# Patient Record
Sex: Female | Born: 1995 | Hispanic: Yes | Smoking: Never smoker
Health system: Southern US, Community
[De-identification: ages and names within clinical notes are randomized; demographics above are authoritative.]

## PROBLEM LIST (undated history)

## (undated) DIAGNOSIS — E282 Polycystic ovarian syndrome: Secondary | ICD-10-CM

## (undated) DIAGNOSIS — F4310 Post-traumatic stress disorder, unspecified: Secondary | ICD-10-CM

## (undated) DIAGNOSIS — E663 Overweight: Secondary | ICD-10-CM

## (undated) DIAGNOSIS — L409 Psoriasis, unspecified: Secondary | ICD-10-CM

## (undated) HISTORY — DX: Overweight: E66.3

## (undated) HISTORY — DX: Post-traumatic stress disorder, unspecified: F43.10

## (undated) HISTORY — DX: Polycystic ovarian syndrome: E28.2

## (undated) HISTORY — DX: Psoriasis, unspecified: L40.9

## (undated) HISTORY — PX: NO PAST SURGERIES: SHX2092

---
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
## 2017-09-06 ENCOUNTER — Encounter: Payer: Self-pay | Admitting: Emergency Medicine

## 2017-09-06 ENCOUNTER — Emergency Department
Admission: EM | Admit: 2017-09-06 | Discharge: 2017-09-06 | Disposition: A | Discharge status: Home / Self Care | Payer: Managed Care, Other (non HMO) | Attending: Emergency Medicine | Admitting: Emergency Medicine

## 2017-09-06 ENCOUNTER — Other Ambulatory Visit: Payer: Self-pay

## 2017-09-06 ENCOUNTER — Emergency Department: Payer: Managed Care, Other (non HMO)

## 2017-09-06 DIAGNOSIS — R079 Chest pain, unspecified: Secondary | ICD-10-CM | POA: Diagnosis not present

## 2017-09-06 LAB — BASIC METABOLIC PANEL
Anion gap: 6 (ref 5–15)
BUN: 10 mg/dL (ref 6–20)
CALCIUM: 8.7 mg/dL — AB (ref 8.9–10.3)
CO2: 27 mmol/L (ref 22–32)
Chloride: 105 mmol/L (ref 101–111)
Creatinine, Ser: 0.78 mg/dL (ref 0.44–1.00)
GFR calc Af Amer: >60 mL/min (ref >60)
GLUCOSE: 93 mg/dL (ref 65–99)
Potassium: 3.9 mmol/L (ref 3.5–5.1)
Sodium: 138 mmol/L (ref 135–145)

## 2017-09-06 LAB — CBC
HCT: 37.7 % (ref 35.0–47.0)
Hemoglobin: 12.7 g/dL (ref 12.0–16.0)
MCH: 28.1 pg (ref 26.0–34.0)
MCHC: 33.7 g/dL (ref 32.0–36.0)
MCV: 83.4 fL (ref 80.0–100.0)
Platelets: 316 10*3/uL (ref 150–440)
RBC: 4.52 MIL/uL (ref 3.80–5.20)
RDW: 13.8 % (ref 11.5–14.5)
WBC: 9.2 10*3/uL (ref 3.6–11.0)

## 2017-09-06 LAB — PREGNANCY, URINE: PREG TEST UR: NEGATIVE

## 2017-09-06 LAB — POCT PREGNANCY, URINE: PREG TEST UR: NEGATIVE

## 2017-09-06 LAB — TROPONIN I

## 2017-09-06 NOTE — ED Triage Notes (Signed)
Pt here via EMS from work with c/o midsternal cp that began a few months ago, worsening today, states pain radiates to her upper back, states she was not doing anything "active" when pain came on today. Denies cp at this time, tearful upon arriving to triage.

## 2017-09-06 NOTE — ED Provider Notes (Signed)
[facility] Emergency Department Provider Note  ___________________________________________   First MD Initiated Contact with Patient 09/06/17 1816     (approximate)  I have reviewed the triage vital signs and the nursing notes.   HISTORY  Chief Complaint Chest Pain   HPI Kimberly Cummings is a 22 y.o. female with a history of psoriasis was presented to the emergency department today with chest pain over the past several months.  She says that she has chest pain across the front of her chest which lasts 1 to 2 minutes it is a pressure type pain which can be severe at times.  She denies any radiation but says that she does feel short of breath when she has the pain.  She said that she did episode of the pain today while at work that lasted for about 2 minutes.  Says that she felt her heart racing at the time and checked her heart rate on her watch which that it was at 116.  She then had a nurse at work check her heart rate and found to be 120.  However, he says the symptoms completely resolved after 2 minutes.  She continues to be symptom-free at this time.  Says that she gets the symptoms several times a day over the past 3 months.  Denies anything during the month such as exertion or anxiety.  No history of blood clots or sudden death at young age in her family.  Is not on any medications.  History reviewed. No pertinent past medical history.  There are no active problems to display for this patient.   History reviewed. No pertinent surgical history.  Prior to Admission medications   Not on File    Allergies Patient has no known allergies.  No family history on file.  Social History Social History   Tobacco Use  . Smoking status: Never Smoker  . Smokeless tobacco: Never Used  Substance Use Topics  . Alcohol use: Yes    Comment: occas.   . Drug use: Not on file    Review of Systems  Constitutional: No fever/chills Eyes: No visual  changes. ENT: No sore throat. Cardiovascular: As above Respiratory: As above Gastrointestinal: No abdominal pain.  No nausea, no vomiting.  No diarrhea.  No constipation. Genitourinary: Negative for dysuria. Musculoskeletal: Negative for back pain. Skin: Negative for rash. Neurological: Negative for headaches, focal weakness or numbness.   ____________________________________________   PHYSICAL EXAM:  VITAL SIGNS: ED Triage Vitals [09/06/17 1637]  Enc Vitals Group     BP 113/74     Pulse Rate 81     Resp 16     Temp 98.6 F (37 C)     Temp Source Oral     SpO2 99 %     Weight 200 lb (90.7 kg)     Height 5\' 2"  (1.575 m)     Head Circumference      Peak Flow      Pain Score 0     Pain Loc      Pain Edu?      Excl. in GC?     Constitutional: Alert and oriented. Well appearing and in no acute distress. Eyes: Conjunctivae are normal.  Head: Atraumatic. Nose: No congestion/rhinnorhea. Mouth/Throat: Mucous membranes are moist.  Neck: No stridor.   Cardiovascular: Normal rate, regular rhythm. Grossly normal heart sounds.  Good peripheral circulation with equal and bilateral radial pulses.  Chest pain is not reproducible to palpation. Respiratory: Normal respiratory  effort.  No retractions. Lungs CTAB. Gastrointestinal: Soft and nontender. No distention.  Musculoskeletal: No lower extremity tenderness nor edema.  No joint effusions. Neurologic:  Normal speech and language. No gross focal neurologic deficits are appreciated. Skin:  Skin is warm, dry and intact. No rash noted. Psychiatric: Mood and affect are normal. Speech and behavior are normal.  ____________________________________________   LABS (all labs ordered are listed, but only abnormal results are displayed)  Labs Reviewed  BASIC METABOLIC PANEL - Abnormal; Notable for the following components:      Result Value   Calcium 8.7 (*)    All other components within normal limits  CBC  TROPONIN I  PREGNANCY,  URINE   ____________________________________________  EKG  ED ECG REPORT I, Arelia Longest, the attending physician, personally viewed and interpreted this ECG.   Date: 09/06/2017  EKG Time: 1632  Rate: 72  Rhythm: normal sinus rhythm  Axis: Normal  Intervals:none  ST&T Change: No ST segment elevations or depressions.  T wave inversions in 3, aVL as well as V2 through V4.  No previous for comparison.  ____________________________________________  RADIOLOGY  No acute finding on the chest x-ray ____________________________________________   PROCEDURES  Procedure(s) performed:   Procedures  Critical Care performed:   ____________________________________________   INITIAL IMPRESSION / ASSESSMENT AND PLAN / ED COURSE  Pertinent labs & imaging results that were available during my care of the patient were reviewed by me and considered in my medical decision making (see chart for details).  Differential diagnosis includes, but is not limited to, ACS, aortic dissection, pulmonary embolism, cardiac tamponade, pneumothorax, pneumonia, pericarditis, myocarditis, GI-related causes including esophagitis/gastritis, and musculoskeletal chest wall pain.   As part of my medical decision making, I reviewed the following data within the electronic MEDICAL RECORD NUMBERNo previous records on file for review.  ----------------------------------------- 7:07 PM on 09/06/2017 -----------------------------------------  Symptoms completely resolved.  Unlikely to be pulmonary embolus secondary to complete resolution of symptoms and very brief and intermittent symptoms over the past 3 months.  EKG abnormalities with T wave abnormality which is nonspecific.  Normal troponin after several months of symptoms.  Patient continues to be symptom medic in the emergency department.  I feel that she is appropriate for outpatient follow-up.  We will give her a cardiology follow-up.  She is understanding of  the treatment plan as well as the diagnosis and willing to comply with outpatient follow-up. ____________________________________________   FINAL CLINICAL IMPRESSION(S) / ED DIAGNOSES  Chest pain    NEW MEDICATIONS STARTED DURING THIS VISIT:  New Prescriptions   No medications on file     Note:  This document was prepared using Dragon voice recognition software and may include unintentional dictation errors.     Myrna Blazer, MD 09/06/17 [place] Moment

## 2017-09-06 NOTE — ED Notes (Signed)
In lobby from EMS. Chest pain.  Pulled for EKG.  NAD

## 2017-09-06 NOTE — ED Notes (Signed)
EMS pt from work, chest pain

## 2017-09-06 NOTE — ED Notes (Signed)
POCT PREG NEG 

## 2017-09-08 ENCOUNTER — Telehealth: Payer: Self-pay | Admitting: *Deleted

## 2017-09-08 NOTE — Telephone Encounter (Signed)
Left message for patient to call and schedule.

## 2017-09-08 NOTE — Telephone Encounter (Signed)
Left message for patient to call and schedule New Patient [facility] follow up visit

## 2017-09-12 NOTE — Telephone Encounter (Signed)
Left message for patient to call and schedule.

## 2017-09-14 NOTE — Telephone Encounter (Signed)
Left message for patient to call and schedule post ED visit 

## 2017-09-19 DIAGNOSIS — R079 Chest pain, unspecified: Secondary | ICD-10-CM

## 2017-09-19 DIAGNOSIS — R0789 Other chest pain: Secondary | ICD-10-CM | POA: Insufficient documentation

## 2017-09-19 DIAGNOSIS — F4310 Post-traumatic stress disorder, unspecified: Secondary | ICD-10-CM | POA: Insufficient documentation

## 2017-09-20 ENCOUNTER — Ambulatory Visit: Payer: Managed Care, Other (non HMO) | Admitting: Cardiology

## 2017-10-11 ENCOUNTER — Encounter: Payer: Self-pay | Admitting: Cardiology

## 2017-10-11 ENCOUNTER — Ambulatory Visit (INDEPENDENT_AMBULATORY_CARE_PROVIDER_SITE_OTHER): Payer: Managed Care, Other (non HMO) | Admitting: Cardiology

## 2017-10-11 VITALS — BP 102/66 | HR 74 | Ht 62.0 in | Wt 219.0 lb

## 2017-10-11 DIAGNOSIS — R0789 Other chest pain: Secondary | ICD-10-CM

## 2017-10-11 DIAGNOSIS — Z9189 Other specified personal risk factors, not elsewhere classified: Secondary | ICD-10-CM | POA: Diagnosis not present

## 2017-10-11 DIAGNOSIS — F4310 Post-traumatic stress disorder, unspecified: Secondary | ICD-10-CM | POA: Diagnosis not present

## 2017-10-11 DIAGNOSIS — R0683 Snoring: Secondary | ICD-10-CM | POA: Insufficient documentation

## 2017-10-11 DIAGNOSIS — E669 Obesity, unspecified: Secondary | ICD-10-CM | POA: Diagnosis not present

## 2017-10-11 DIAGNOSIS — E785 Hyperlipidemia, unspecified: Secondary | ICD-10-CM | POA: Diagnosis not present

## 2017-10-11 HISTORY — DX: Snoring: R06.83

## 2017-10-11 NOTE — Patient Instructions (Signed)
Medication Instructions:  Your physician recommends that you continue on your current medications as directed. Please refer to the Current Medication list given to you today.   Labwork: None  Testing/Procedures: You had an EKG today.   Your physician has requested that you have an echocardiogram. Echocardiography is a painless test that uses sound waves to create images of your heart. It provides your doctor with information about the size and shape of your heart and how well your heart's chambers and valves are working. This procedure takes approximately one hour. There are no restrictions for this procedure.  Your physician has requested that you have a stress echocardiogram. For further information please visit [email or web address] Please follow instruction sheet as given.  You have been referred for a sleep study. You will be contacted to schedule this appointment.   Follow-Up: Your physician recommends that you schedule a follow-up appointment in: 1 month.   If you need a refill on your cardiac medications before your next appointment, please call your pharmacy.   Thank you for choosing [facility]! Mady Gemma, RN [phone]

## 2017-10-11 NOTE — Progress Notes (Signed)
Cardiology Consultation:    Date:  10/11/2017   ID:  Kimberly Cummings, DOB 11-02-1995, MRN [id number]  PCP:  Renford Dills, MD  Cardiologist:  Gypsy Balsam, MD   Referring MD: Renford Dills, MD   Chief Complaint  Patient presents with  . Follow up from ED  Chest pain  History of Present Illness:    Kimberly Cummings is a 22 y.o. female who is being seen today for the evaluation of chest pain at the request of Renford Dills, MD.  For last few months she is being experiencing chest pain.  She describes as heaviness in the chest without radiation severity of the sensation is between 5-9 sometimes it is only mild and she can simply do what she does without being attention to the goes away sometimes she have to stop doing what she does sit down take few deep breaths and typically pain will go away typical duration of the pain is between 30 seconds to about 45 seconds.  Sometimes even shorter.  She does have some shortness of breath associated with this sensation there is also some sweating.  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 She does not have any significant risk factors for coronary artery disease but her LDL is 116 and she does have some family history of premature coronary artery disease. She does not smoke. She snores a lot and she tells me that sometimes she wakes up in the middle of the night with dry mouth.  It is also very easy for her to fall asleep during the day.  Past Medical History:  Diagnosis Date  . Overweight   . Psoriasis   . PTSD (post-traumatic stress disorder)     Past Surgical History:  Procedure Laterality Date  . NO PAST SURGERIES      Current Medications: No outpatient medications have been marked as taking for the 10/11/17 encounter (Office Visit) with  Georgeanna Lea, MD.     Allergies:   Patient has no known allergies.   Social History   Socioeconomic History  . Marital status: Single    Spouse name: Not on file  . Number of children: Not on file  . Years of education: Not on file  . Highest education level: Not on file  Occupational History  . Not on file  Social Needs  . Financial resource strain: Not on file  . Food insecurity:    Worry: Not on file    Inability: Not on file  . Transportation needs:    Medical: Not on file    Non-medical: Not on file  Tobacco Use  . Smoking status: Never Smoker  . Smokeless tobacco: Never Used  Substance and Sexual Activity  . Alcohol use: Yes    Comment: occas.   . Drug use: Never  . Sexual activity: Not on file  Lifestyle  . Physical activity:    Days per week: Not on file    Minutes per session: Not on file  . Stress: Not on file  Relationships  . Social connections:    Talks on phone: Not on file    Gets together: Not on file    Attends religious service: Not on file    Active member of club or organization: Not on file  Attends meetings of clubs or organizations: Not on file    Relationship status: Not on file  Other Topics Concern  . Not on file  Social History Narrative  . Not on file     Family History: The patient's family history includes Hypertension in her father. ROS:   Please see the history of present illness.    All 14 point review of systems negative except as described per history of present illness.  EKGs/Labs/Other Studies Reviewed:    The following studies were reviewed today: EKG done in the emergency room showed normal sinus rhythm normal P interval T inversion in lead V1 to V3 and even V4.  There was also some T inversion in V3 no changes are nonspecific partially meeting criteria for juvenile pattern.    Recent Labs: 09/06/2017: BUN 10; Creatinine, Ser 0.78; Hemoglobin 12.7; Platelets 316; Potassium 3.9; Sodium 138  Recent Lipid  Panel No results found for: CHOL, TRIG, HDL, CHOLHDL, VLDL, LDLCALC, LDLDIRECT  Physical Exam:    VS:  BP 102/66   Pulse 74   Ht  (1.575 m)   Wt 219 lb (99.3 kg)   SpO2 98%   BMI 40.06 kg/m     Wt Readings from Last 3 Encounters:  10/11/17 219 lb (99.3 kg)  09/06/17 200 lb (90.7 kg)     GEN:  Well nourished, well developed in no acute distress HEENT: Normal NECK: No JVD; No carotid bruits LYMPHATICS: No lymphadenopathy CARDIAC: RRR, no murmurs, no rubs, no gallops RESPIRATORY:  Clear to auscultation without rales, wheezing or rhonchi  ABDOMEN: Soft, non-tender, non-distended MUSCULOSKELETAL:  No edema; No deformity  SKIN: Warm and dry NEUROLOGIC:  Alert and oriented x 3 PSYCHIATRIC:  Normal affect   ASSESSMENT:    1. Atypical chest pain   2. Dyslipidemia   3. Snoring   4. Posttraumatic stress disorder   5. Obesity (BMI 30-39.9)    PLAN:    In order of problems listed above:  1. Atypical chest pain in this young woman with very few risk factors for coronary artery disease.  I doubt very much that this is a cardiac related but I am worried about her EKG having some minor abnormality which could be simply related to juvenile pattern.  I think the best course of action will be to do the following I will ask her to have echocardiogram to assess left ventricular ejection fraction.  That will also allow me to look at the pulmonary pressure.  I suspect she does have significant sleep apnea she may have already elevated pulmonary artery pressure which refers shortness of breath with exertion.  We will not initiate any medications for now.  Again I have a low level suspicion that she gets significant coronary artery disease.  She will be scheduled also to have stress echocardiogram to make sure there is no inducible ischemia 2. Dyslipidemia her LDL is 116 her HDL is 46.  Will wait for results of stress test and evaluation in order to judge her therapy.  So far she is being  instructed about dieting and also exercises that should help. 3. PTSD: This followed by primary care physician 4. Snoring I suspect sleep apnea.  She agreed to have sleep study which I think will be very reasonable.  Obviously weight management will help if she loses a few pounds that will help with sleep apnea as well.  Her back in my office in about 1 month or sooner if she had a problem  Medication Adjustments/Labs and Tests Ordered: Current medicines are reviewed at length with the patient today.  Concerns regarding medicines are outlined above.  No orders of the defined types were placed in this encounter.  No orders of the defined types were placed in this encounter.   Signed, Georgeanna Lea, MD, [facility]. 10/11/2017 3:26 PM    [facility]

## 2017-10-13 ENCOUNTER — Telehealth (HOSPITAL_COMMUNITY): Payer: Self-pay | Admitting: *Deleted

## 2017-10-13 NOTE — Telephone Encounter (Signed)
Left message on voicemail per DPR in reference to upcoming appointment scheduled on 10/19/17 at 2:30 with detailed instructions given per Stress Test Requisition Sheet for the test. LM to arrive 30 minutes early, and that it is imperative to arrive on time for appointment to keep from having the test rescheduled. If you need to cancel or reschedule your appointment, please call the office within 24 hours of your appointment. Failure to do so may result in a cancellation of your appointment, and a $50 no show fee. Phone number given for call back for any questions. Daneil DolinSharon S Brooks

## 2017-10-18 ENCOUNTER — Telehealth (HOSPITAL_COMMUNITY): Payer: Self-pay | Admitting: Cardiology

## 2017-10-19 ENCOUNTER — Other Ambulatory Visit (HOSPITAL_COMMUNITY): Payer: Managed Care, Other (non HMO)

## 2017-10-19 ENCOUNTER — Ambulatory Visit (HOSPITAL_COMMUNITY): Payer: Managed Care, Other (non HMO) | Attending: Cardiology

## 2017-10-19 ENCOUNTER — Other Ambulatory Visit: Payer: Self-pay

## 2017-10-19 DIAGNOSIS — R0789 Other chest pain: Secondary | ICD-10-CM | POA: Diagnosis present

## 2017-10-20 ENCOUNTER — Telehealth (HOSPITAL_COMMUNITY): Payer: Self-pay | Admitting: *Deleted

## 2017-10-20 NOTE — Telephone Encounter (Signed)
Left message on voicemail per DPR in reference to upcoming appointment scheduled on 10/26/17 at 2:30 with detailed instructions given per Myocardial Perfusion Study Information Sheet for the test. LM to arrive 15 minutes early, and that it is imperative to arrive on time for appointment to keep from having the test rescheduled. If you need to cancel or reschedule your appointment, please call the office within 24 hours of your appointment. Failure to do so may result in a cancellation of your appointment, and a $50 no show fee. Phone number given for call back for any questions.

## 2017-10-20 NOTE — Telephone Encounter (Signed)
User: Trina AoGRIFFIN, Iris Hairston A Date/time: 10/18/17 2:33 PM  Comment: Tried calling patient 2x to r/s stress echo appt but phone just rang and did not have a VM.  Context:  Outcome: No Answer/Busy  Phone number: [phone] Phone Type: Home Phone  Comm. type: Telephone Call type: Outgoing  Contact: Darlina Guysodriguez, Marticia Relation to patient: Self

## 2017-10-26 ENCOUNTER — Ambulatory Visit (HOSPITAL_COMMUNITY): Payer: Managed Care, Other (non HMO) | Attending: Cardiology

## 2017-10-26 ENCOUNTER — Ambulatory Visit (HOSPITAL_BASED_OUTPATIENT_CLINIC_OR_DEPARTMENT_OTHER): Payer: Managed Care, Other (non HMO)

## 2017-10-26 DIAGNOSIS — R0789 Other chest pain: Secondary | ICD-10-CM

## 2017-11-01 NOTE — Addendum Note (Signed)
Addended by: Crist FatLOCKHART, CATHERINE P on: 11/01/2017 08:34 AM   Modules accepted: Orders

## 2017-11-03 ENCOUNTER — Telehealth: Payer: Self-pay | Admitting: *Deleted

## 2017-11-03 NOTE — Telephone Encounter (Signed)
[facility]

## 2017-11-03 NOTE — Telephone Encounter (Signed)
-----   Message from Catherine P Lockhart, RN sent at 11/01/2017  8:33 AM EDT ----- Regarding: Please schedule Please schedule this patient for a sleep study due to snoring in evaluation of sleep apnea. Thanks! 

## 2017-11-10 ENCOUNTER — Ambulatory Visit: Payer: Managed Care, Other (non HMO) | Admitting: Cardiology

## 2017-11-14 ENCOUNTER — Telehealth: Payer: Self-pay | Admitting: *Deleted

## 2017-11-14 NOTE — Telephone Encounter (Signed)
-----   Message from Crist Fatatherine P Lockhart, RN sent at 11/01/2017  8:33 AM EDT ----- Regarding: Please schedule Please schedule this patient for a sleep study due to snoring in evaluation of sleep apnea. Thanks!

## 2017-11-14 NOTE — Telephone Encounter (Signed)
Staff message sent to Leda QuailCatherine [employer] denied in lab study. Approved HST. Ok to schedule HST.

## 2017-11-15 ENCOUNTER — Other Ambulatory Visit: Payer: Self-pay | Admitting: Cardiology

## 2017-11-15 DIAGNOSIS — R0683 Snoring: Secondary | ICD-10-CM

## 2017-11-21 ENCOUNTER — Telehealth: Payer: Self-pay | Admitting: *Deleted

## 2017-11-21 NOTE — Telephone Encounter (Signed)
Left message for patient on cell phone per DPR informing her that her sleep study will be an at home test due to insurance. Advised her that she is scheduled to pick up the equipment on 12/18/17 at 1:30 pm. Asked for patient to return my call to discuss the details of this process.

## 2017-11-21 NOTE — Addendum Note (Signed)
Addended by: Crist FatLOCKHART, CATHERINE P on: 11/21/2017 03:05 PM   Modules accepted: Orders

## 2017-12-18 ENCOUNTER — Ambulatory Visit (HOSPITAL_BASED_OUTPATIENT_CLINIC_OR_DEPARTMENT_OTHER): Payer: Managed Care, Other (non HMO) | Attending: Cardiology | Admitting: Cardiovascular Disease

## 2017-12-18 VITALS — Ht 62.0 in | Wt 215.0 lb

## 2017-12-18 DIAGNOSIS — G4710 Hypersomnia, unspecified: Secondary | ICD-10-CM | POA: Diagnosis not present

## 2017-12-18 DIAGNOSIS — G4719 Other hypersomnia: Secondary | ICD-10-CM

## 2017-12-18 DIAGNOSIS — R0683 Snoring: Secondary | ICD-10-CM | POA: Diagnosis not present

## 2017-12-26 ENCOUNTER — Encounter (HOSPITAL_BASED_OUTPATIENT_CLINIC_OR_DEPARTMENT_OTHER): Payer: Self-pay | Admitting: Cardiovascular Disease

## 2017-12-26 DIAGNOSIS — G4733 Obstructive sleep apnea (adult) (pediatric): Secondary | ICD-10-CM | POA: Diagnosis not present

## 2017-12-26 NOTE — Procedures (Signed)
    Patient Name: Kimberly Cummings, Kimberly Cummings Study Date: 12/18/2017 Gender: Female D.O.B: 1995-07-27 Age (years): 22 Referring Provider: Georgeanna Leaobert J Krasowski Height (inches): 62 Interpreting Physician: Nicki Guadalajarahomas Kelly MD, ABSM Weight (lbs): 215 [place]GT: [place] BMI: 39 MRN: [id number] Neck Size: 14.50  CLINICAL INFORMATION Sleep Study Type: HST  Indication for sleep study: Snoring  Epworth Sleepiness Score: 15  SLEEP STUDY TECHNIQUE A multi-channel overnight portable sleep study was performed. The channels recorded were: nasal airflow, thoracic respiratory movement, and oxygen saturation with a pulse oximetry. Snoring was also monitored.  MEDICATIONS none Patient self administered medications include: N/A.  SLEEP ARCHITECTURE Patient was studied for 426.6 minutes. The sleep efficiency was 100.0 % and the patient was supine for 35%. The arousal index was 0.0 per hour.  RESPIRATORY PARAMETERS The overall AHI was 1.1 per hour, with a central apnea index of 0.0 per hour.  The oxygen nadir was 90% during sleep.  CARDIAC DATA Mean heart rate during sleep was 66.9 bpm.  IMPRESSIONS - No significant obstructive sleep apnea occurred during this study (AHI  1.1/h). - No significant central sleep apnea occurred during this study (CAI = 0.0/h). - Sleep efficiency 100%. - The patient had minimal or no oxygen desaturation during the study (Min O2 = 90%) - Patient snored 2.5% during the sleep.  DIAGNOSIS - Normal study  - Excessive Daytime Sleepiness  RECOMMENDATIONS - There is no evidence for OSA on this home study. - In this patient with significant daytime sleepiness (ESS 15) and 100% sleep eficiency, if patient continues to be sleepy consider an evaluation for idiopathic hypersomnolence or narcolepsy with an in-lab overnight PSG followed by a 4- 5 nap multiple latency sleep test (MLST) following the overnight PSG - Avoid alcohol, sedatives and other CNS depressants that may  worsen sleep apnea and disrupt normal sleep architecture. - Sleep hygiene should be reviewed to assess factors that may improve sleep quality. - Weight management (BMI 39) and regular exercise should be initiated or continued.   [Electronically signed] 12/26/2017 05:54 PM  Nicki Guadalajarahomas Kelly MD, [facility] Center[facility]ACC, ABSM Diplomate, American Board of Sleep Medicine   NPI: [id number] [facility] PH: [phone]   FX: [phone] ACCREDITED BY THE AMERICAN ACADEMY OF SLEEP MEDICINE

## 2017-12-28 ENCOUNTER — Telehealth: Payer: Self-pay | Admitting: *Deleted

## 2017-12-28 ENCOUNTER — Other Ambulatory Visit: Payer: Self-pay | Admitting: Cardiovascular Disease

## 2017-12-28 DIAGNOSIS — R400 Somnolence: Secondary | ICD-10-CM

## 2017-12-28 DIAGNOSIS — R0683 Snoring: Secondary | ICD-10-CM

## 2017-12-28 NOTE — Telephone Encounter (Signed)
Faxed PA for PSG/MSLT to [employer].

## 2017-12-28 NOTE — Telephone Encounter (Signed)
-----   Message from Lennette Biharihomas A Kelly, MD sent at 12/26/2017  6:02 PM EDT ----- Kimberly Cummings  Please notify pt of normal study, but consider overnight PSG/MLST for excessive daytime sleepiness

## 2017-12-28 NOTE — Telephone Encounter (Signed)
Patient returned a call to me and was given sleep study results and recommendations. She has decided to do the NSPG/MLST to further evaluate her daytime somnolence.

## 2017-12-28 NOTE — Telephone Encounter (Signed)
Left message to return a call to discuss sleep study results. 

## 2017-12-28 NOTE — Progress Notes (Signed)
Patient notified

## 2018-01-08 ENCOUNTER — Other Ambulatory Visit: Payer: Self-pay

## 2018-01-08 ENCOUNTER — Encounter (HOSPITAL_COMMUNITY): Payer: Self-pay | Admitting: Emergency Medicine

## 2018-01-08 ENCOUNTER — Emergency Department (HOSPITAL_COMMUNITY)
Admission: EM | Admit: 2018-01-08 | Discharge: 2018-01-08 | Disposition: A | Discharge status: Home / Self Care | Payer: Managed Care, Other (non HMO) | Attending: Emergency Medicine | Admitting: Emergency Medicine

## 2018-01-08 DIAGNOSIS — Z5321 Procedure and treatment not carried out due to patient leaving prior to being seen by health care provider: Secondary | ICD-10-CM | POA: Insufficient documentation

## 2018-01-08 DIAGNOSIS — M549 Dorsalgia, unspecified: Secondary | ICD-10-CM | POA: Diagnosis not present

## 2018-01-08 DIAGNOSIS — M79604 Pain in right leg: Secondary | ICD-10-CM | POA: Diagnosis not present

## 2018-01-08 DIAGNOSIS — M79605 Pain in left leg: Secondary | ICD-10-CM | POA: Insufficient documentation

## 2018-01-08 NOTE — ED Notes (Signed)
No answer when called for room 

## 2018-01-08 NOTE — ED Notes (Signed)
Pt called to be roomed with no response.  RN notified. 

## 2018-01-08 NOTE — ED Triage Notes (Signed)
Pt reports having mid back pain that started last week now having radiation to bilateral legs.

## 2018-01-10 NOTE — ED Notes (Signed)
Follow up call made  No answer  01/10/18  0917  s Halil Rentz rn

## 2018-01-16 ENCOUNTER — Telehealth: Payer: Self-pay | Admitting: *Deleted

## 2018-01-16 NOTE — Telephone Encounter (Signed)
Resent HST to [employer] in attempt to get MLST approved.

## 2018-01-24 ENCOUNTER — Telehealth: Payer: Self-pay | Admitting: *Deleted

## 2018-01-24 NOTE — Telephone Encounter (Signed)
Left message to return a call to give MSLT/PSG appointment.

## 2018-01-25 NOTE — Telephone Encounter (Signed)
Patient notified of MSLT/PSG appointment. She informs me that her insurance will elapse at the end of the month or the beginning of nest month. I told her that her insurance has already given authorization to have the studies, however I don't know what how they will handle the claims that were approved prior to her policy termination. I recommended that she contact [employer] to ask about this and/or call the sleep lab to see if her appointment can be moved up to be done before her termination date. Patient voice understanding. [facility] contact information was given to the patient.

## 2018-02-25 ENCOUNTER — Encounter (HOSPITAL_BASED_OUTPATIENT_CLINIC_OR_DEPARTMENT_OTHER): Payer: Managed Care, Other (non HMO)

## 2018-02-26 ENCOUNTER — Encounter (HOSPITAL_BASED_OUTPATIENT_CLINIC_OR_DEPARTMENT_OTHER): Payer: Managed Care, Other (non HMO)

## 2018-07-22 ENCOUNTER — Other Ambulatory Visit: Payer: Self-pay

## 2018-07-22 ENCOUNTER — Emergency Department (HOSPITAL_COMMUNITY): Payer: Managed Care, Other (non HMO)

## 2018-07-22 ENCOUNTER — Encounter (HOSPITAL_COMMUNITY): Payer: Self-pay | Admitting: Emergency Medicine

## 2018-07-22 ENCOUNTER — Emergency Department (HOSPITAL_COMMUNITY)
Admission: EM | Admit: 2018-07-22 | Discharge: 2018-07-22 | Disposition: A | Discharge status: Home / Self Care | Payer: Managed Care, Other (non HMO) | Attending: Emergency Medicine | Admitting: Emergency Medicine

## 2018-07-22 DIAGNOSIS — R0789 Other chest pain: Secondary | ICD-10-CM | POA: Insufficient documentation

## 2018-07-22 DIAGNOSIS — R002 Palpitations: Secondary | ICD-10-CM | POA: Diagnosis not present

## 2018-07-22 DIAGNOSIS — R0602 Shortness of breath: Secondary | ICD-10-CM | POA: Diagnosis not present

## 2018-07-22 DIAGNOSIS — Z79899 Other long term (current) drug therapy: Secondary | ICD-10-CM | POA: Insufficient documentation

## 2018-07-22 LAB — BASIC METABOLIC PANEL
ANION GAP: 8 (ref 5–15)
BUN: 8 mg/dL (ref 6–20)
CALCIUM: 9.7 mg/dL (ref 8.9–10.3)
CO2: 24 mmol/L (ref 22–32)
Chloride: 107 mmol/L (ref 98–111)
Creatinine, Ser: 0.91 mg/dL (ref 0.44–1.00)
GFR calc Af Amer: >60 mL/min (ref >60)
Glucose, Bld: 100 mg/dL — ABNORMAL HIGH (ref 70–99)
Potassium: 3.6 mmol/L (ref 3.5–5.1)
SODIUM: 139 mmol/L (ref 135–145)

## 2018-07-22 LAB — CBC
HCT: 45.3 % (ref 36.0–46.0)
Hemoglobin: 14 g/dL (ref 12.0–15.0)
MCH: 27.9 pg (ref 26.0–34.0)
MCHC: 30.9 g/dL (ref 30.0–36.0)
MCV: 90.2 fL (ref 80.0–100.0)
PLATELETS: 260 10*3/uL (ref 150–400)
RBC: 5.02 MIL/uL (ref 3.87–5.11)
RDW: 12.3 % (ref 11.5–15.5)
WBC: 7.7 10*3/uL (ref 4.0–10.5)
nRBC: 0 % (ref 0.0–0.2)

## 2018-07-22 LAB — I-STAT TROPONIN, ED: TROPONIN I, POC: 0 ng/mL (ref 0.00–0.08)

## 2018-07-22 LAB — I-STAT BETA HCG BLOOD, ED (MC, WL, AP ONLY): I-stat hCG, quantitative: <5 m[IU]/mL (ref <5)

## 2018-07-22 LAB — D-DIMER, QUANTITATIVE: D-Dimer, Quant: 0.7 ug/mL-FEU — ABNORMAL HIGH (ref 0.00–0.50)

## 2018-07-22 MED ORDER — IOHEXOL 350 MG/ML SOLN
100.0000 mL | Freq: Once | INTRAVENOUS | Status: AC | PRN
  Administered 2018-07-22: 100 mL via INTRAVENOUS

## 2018-07-22 MED ORDER — SODIUM CHLORIDE (PF) 0.9 % IJ SOLN
INTRAMUSCULAR | Status: AC
  Filled 2018-07-22: qty 50

## 2018-07-22 NOTE — ED Provider Notes (Signed)
[facility]-EMERGENCY DEPT Provider Note   CSN: [id number] Arrival date & time: 07/22/18  1157    History   Chief Complaint Chief Complaint  Patient presents with  . Chest Pain  . Shortness of Breath    HPI Kimberly Cummings is a 23 y.o. female who presents today for chest pain.  Thursday she reports that she felt fluttering in her chest that was off and on through the day.  She reports that on Friday, 2 days ago, she did not have any symptoms.  Today she develop shortness of breath and chest pain at about 9 AM.  She denies any palpitations or fluttering in her chest.  She says that the pain initially started in her lower chest however has radiated up to her bilateral upper chest and both sides of her neck.  She denies any recent leg swelling.  She does take hormone based birth control.  She denies coffee or soda use saying that she intermittently has 1 cup of coffee however it is not a regular thing.  No nausea vomiting or diarrhea.  She has previously seen a cardiologist as she was having intermittent chest pain and had slightly abnormal EKG.  She had a echo stress test on 6/19 that did not show any wall motion abnormalities with a normal EF.  She also had a sleep study for snoring which did not have evidence of sleep apnea.     HPI  Past Medical History:  Diagnosis Date  . Overweight   . Psoriasis   . PTSD (post-traumatic stress disorder)     Patient Active Problem List   Diagnosis Date Noted  . Dyslipidemia 10/11/2017  . Snoring 10/11/2017  . Obesity (BMI 30-39.9) 10/11/2017  . Posttraumatic stress disorder 09/19/2017  . Atypical chest pain 09/19/2017    Past Surgical History:  Procedure Laterality Date  . NO PAST SURGERIES       OB History   No obstetric history on file.      Home Medications    Prior to Admission medications   Medication Sig Start Date End Date Taking? Authorizing Provider  clobetasol cream (TEMOVATE) 0.05 % Apply 1  application topically daily as needed (psoriasis).  06/21/18  Yes [provider]  ibuprofen (ADVIL,MOTRIN) 200 MG tablet Take 800 mg by mouth every 6 (six) hours as needed for headache.   Yes [provider]  LAYOLIS FE 0.8-25 MG-MCG tablet Chew 1 tablet by mouth every evening. 07/03/18  Yes [provider]    Family History Family History  Problem Relation Age of Onset  . Hypertension Father     Social History Social History   Tobacco Use  . Smoking status: Never Smoker  . Smokeless tobacco: Never Used  Substance Use Topics  . Alcohol use: Yes    Comment: occas.   . Drug use: Never     Allergies   Patient has no known allergies.   Review of Systems Review of Systems  Constitutional: Negative for chills and fever.  HENT: Negative for congestion and ear pain.   Respiratory: Positive for shortness of breath. Negative for cough, choking, chest tightness and wheezing.   Cardiovascular: Positive for chest pain and palpitations (Not currently). Negative for leg swelling.  Gastrointestinal: Negative for abdominal pain, diarrhea, nausea and vomiting.  All other systems reviewed and are negative.    Physical Exam Updated Vital Signs BP 110/71   Pulse (!) 59   Resp 18   Ht 5\' 2"  (1.575  m)   Wt 95.3 kg   LMP 06/29/2018   SpO2 99%   BMI 38.41 kg/m   Physical Exam Vitals signs and nursing note reviewed.  Constitutional:      General: She is not in acute distress.    Appearance: She is well-developed.  HENT:     Head: Normocephalic and atraumatic.  Eyes:     Conjunctiva/sclera: Conjunctivae normal.  Neck:     Musculoskeletal: Neck supple.  Cardiovascular:     Rate and Rhythm: Normal rate and regular rhythm.     Pulses:          Radial pulses are 2+ on the right side and 2+ on the left side.       Dorsalis pedis pulses are 2+ on the right side and 2+ on the left side.       Posterior tibial pulses are 2+ on the right side and 2+ on the left  side.     Heart sounds: Normal heart sounds. No murmur. No friction rub. No gallop.   Pulmonary:     Effort: Pulmonary effort is normal. No tachypnea or respiratory distress.     Breath sounds: Normal breath sounds. No decreased breath sounds.  Chest:     Chest wall: No deformity, tenderness or crepitus.  Abdominal:     General: Bowel sounds are normal.     Palpations: Abdomen is soft.     Tenderness: There is no abdominal tenderness.  Musculoskeletal: Normal range of motion.     Right lower leg: She exhibits no tenderness. No edema.     Left lower leg: She exhibits no tenderness. No edema.  Skin:    General: Skin is warm and dry.  Neurological:     General: No focal deficit present.     Mental Status: She is alert and oriented to person, place, and time.  Psychiatric:        Mood and Affect: Mood normal.        Behavior: Behavior normal.      ED Treatments / Results  Labs (all labs ordered are listed, but only abnormal results are displayed) Labs Reviewed  BASIC METABOLIC PANEL - Abnormal; Notable for the following components:      Result Value   Glucose, Bld 100 (*)    All other components within normal limits  D-DIMER, QUANTITATIVE (NOT AT [facility]) - Abnormal; Notable for the following components:   D-Dimer, Quant 0.70 (*)    All other components within normal limits  CBC  I-STAT TROPONIN, ED  I-STAT BETA HCG BLOOD, ED ([facility], [facility], [facility] ONLY)    EKG None  Radiology Dg Chest 2 View  Result Date: 07/22/2018 CLINICAL DATA:  Chest pain EXAM: CHEST - 2 VIEW COMPARISON:  09/06/2017 chest radiograph. FINDINGS: Low lung volumes. Stable cardiomediastinal silhouette with normal heart size. No pneumothorax. No pleural effusion. Lungs appear clear, with no acute consolidative airspace disease and no pulmonary edema. IMPRESSION: No active cardiopulmonary disease. Electronically Signed   By: Delbert Phenix M.D.   On: 07/22/2018 13:07   Ct Angio Chest Pe W/cm &/or Wo Cm  Result Date:  07/22/2018 CLINICAL DATA:  Centralized chest pain that radiates to the left side of the neck with shortness of breath. Subjective palpitations. Elevated D-dimer. Evaluate for pulmonary embolism. EXAM: CT ANGIOGRAPHY CHEST WITH CONTRAST TECHNIQUE: Multidetector CT imaging of the chest was performed using the standard protocol during bolus administration of intravenous contrast. Multiplanar CT image reconstructions and MIPs were obtained to  evaluate the vascular anatomy. CONTRAST:  OMNIPAQUE IOHEXOL 350 MG/ML SOLN COMPARISON:  Chest radiograph-earlier same day FINDINGS: Examination is minimally degraded secondary to patient respiratory artifact. Vascular Findings: There is adequate opacification of the pulmonary arterial system with the main pulmonary artery measuring 401 Hounsfield units. There are no discrete filling defects within the pulmonary arterial tree to suggest pulmonary embolism. Normal caliber of the main pulmonary artery. Borderline cardiomegaly.  No pericardial effusion. Normal caliber of the thoracic aorta. Conventional configuration of the aortic arch. Review of the MIP images confirms the above findings. ---------------------------------------------------------------------------------- Nonvascular Findings: Mediastinum/Lymph Nodes: No bulky mediastinal, hilar axillary lymphadenopathy. Lungs/Pleura: Minimal dependent subpleural ground-glass atelectasis. No discrete focal airspace opacities. No pleural effusion or pneumothorax. The central pulmonary airways appear widely patent. No discrete pulmonary nodules. Upper abdomen: Limited early arterial phase evaluation of the upper abdomen demonstrates diffuse decreased attenuation of the hepatic parenchyma suggestive of hepatic steatosis. Musculoskeletal: No acute or aggressive osseous abnormalities. Regional soft tissues appear normal. Normal appearance of the thyroid gland. IMPRESSION: 1. No acute cardiopulmonary disease. Specifically, no evidence of  pulmonary embolism. 2. Suspected hepatic steatosis. Correlation with LFTs could be performed as clinically indicated. Electronically Signed   By: Simonne Come M.D.   On: 07/22/2018 14:39    Procedures Procedures (including critical care time)  Medications Ordered in ED Medications  sodium chloride (PF) 0.9 % injection (has no administration in time range)  iohexol (OMNIPAQUE) 350 MG/ML injection 100 mL (100 mLs Intravenous Contrast Given 07/22/18 1412)     Initial Impression / Assessment and Plan / ED Course  I have reviewed the triage vital signs and the nursing notes.  Pertinent labs & imaging results that were available during my care of the patient were reviewed by me and considered in my medical decision making (see chart for details).  Clinical Course as of Jul 21 1548  Wynelle Link Jul 22, 2018  1434 D-Dimer, Quant(!): 0.70 [EH]    Clinical Course User Index [EH] Cristina Gong, PA-C       Patient is to be discharged with recommendation to follow up with PCP in regards to today's hospital visit. Chest pain is not likely of cardiac or pulmonary etiology d/t presentation, , VSS, no  murmur, RRR, breath sounds equal bilaterally, EKG without acute abnormalities, negative troponin, and negative CXR.  Pain is been going on for more than 3 hours at the time of troponin draw, and she is young with recent normal stress therefore do not feel the need for repeat troponin.  Unable to apply PERC criteria as she takes hormone based birth control, therefore d-dimer was obtained.  Discussed with her that d-dimer is only slightly elevated and discussed risks and benefits of additional evaluation.  She elected for CT angios PE study of chest which was performed without evidence of PE.  Did discuss recommendation for follow-up with liver enzymes.  Pt has been advised to return to the ED if CP becomes exertional, associated with diaphoresis or nausea, radiates to left jaw/arm, worsens or becomes concerning  in any way. Pt appears reliable for follow up and is agreeable to discharge.     Final Clinical Impressions(s) / ED Diagnoses   Final diagnoses:  Atypical chest pain    ED Discharge Orders    None       Norman Clay 07/22/18 1552    Azalia Bilis, MD 07/22/18 [phone]

## 2018-07-22 NOTE — Discharge Instructions (Signed)
Your work-up today was reassuring.  Please follow-up with your primary care doctor to have your liver enzymes checked.  Please follow-up with your cardiologist.  If you have worsening symptoms or additional concerns please seek additional medical care and evaluation.

## 2018-07-22 NOTE — ED Triage Notes (Signed)
Pt c/o central chest pains that radiated to left side of neck this morning with SOB. Pt reports on Thursday felt heart fluttering then again on Saturday. Pt takes Water quality scientist

## 2018-07-22 NOTE — ED Notes (Signed)
Patient transported to X-ray 

## 2018-08-02 ENCOUNTER — Inpatient Hospital Stay: Payer: Managed Care, Other (non HMO) | Admitting: Family Medicine

## 2018-08-09 ENCOUNTER — Telehealth: Payer: Self-pay

## 2018-08-09 NOTE — Telephone Encounter (Signed)
Called patient to do their pre-visit COVID screening.  Have you recently traveled internationally([place], [place], [place], [place], [place]) or within the [place] to a hotspot area([place], [place], [place], [place], [place])? no  Are you currently experiencing any of the following: fever, cough, SHOB, fatigue? no  Have you been in contact with anyone who has recently travelled? no  Have you been in contact with anyone who is experiencing fever, cough, SHOB, fatigue or been diagnosed with COVID? no

## 2018-08-10 ENCOUNTER — Other Ambulatory Visit: Payer: Self-pay

## 2018-08-10 ENCOUNTER — Telehealth: Payer: Self-pay

## 2018-08-10 ENCOUNTER — Encounter: Payer: Self-pay | Admitting: Family Medicine

## 2018-08-10 ENCOUNTER — Ambulatory Visit (INDEPENDENT_AMBULATORY_CARE_PROVIDER_SITE_OTHER): Payer: Managed Care, Other (non HMO) | Admitting: Family Medicine

## 2018-08-10 VITALS — BP 118/79 | HR 85 | Temp 98.7°F | Resp 17 | Ht 62.0 in | Wt 212.4 lb

## 2018-08-10 DIAGNOSIS — E669 Obesity, unspecified: Secondary | ICD-10-CM

## 2018-08-10 DIAGNOSIS — R0789 Other chest pain: Secondary | ICD-10-CM | POA: Diagnosis not present

## 2018-08-10 DIAGNOSIS — Z6838 Body mass index (BMI) 38.0-38.9, adult: Secondary | ICD-10-CM | POA: Diagnosis not present

## 2018-08-10 DIAGNOSIS — Z3202 Encounter for pregnancy test, result negative: Secondary | ICD-10-CM | POA: Diagnosis not present

## 2018-08-10 DIAGNOSIS — Z7689 Persons encountering health services in other specified circumstances: Secondary | ICD-10-CM

## 2018-08-10 DIAGNOSIS — Z1389 Encounter for screening for other disorder: Secondary | ICD-10-CM | POA: Diagnosis not present

## 2018-08-10 LAB — POCT URINALYSIS DIP (CLINITEK)
BILIRUBIN UA: NEGATIVE
BILIRUBIN UA: NEGATIVE mg/dL
Blood, UA: NEGATIVE
Glucose, UA: NEGATIVE mg/dL
Nitrite, UA: NEGATIVE
POC PROTEIN,UA: NEGATIVE
SPEC GRAV UA: 1.02 (ref 1.010–1.025)
Urobilinogen, UA: 0.2 E.U./dL
pH, UA: 7.5 (ref 5.0–8.0)

## 2018-08-10 LAB — POCT URINE PREGNANCY: Preg Test, Ur: NEGATIVE

## 2018-08-10 MED ORDER — MELOXICAM 15 MG PO TABS: 15.0000 mg | ORAL_TABLET | Freq: Every day | ORAL | 1 refills | Status: DC

## 2018-08-10 NOTE — Telephone Encounter (Signed)
Called patient to see if she was agreeable to starting Meloxicam 15 mg once a day PRN for the chest pain. Was discussed in office visit but she didn't give a definitive answer prior to leaving. Patient states that she would like Rx sent to pharmacy on file.

## 2018-08-10 NOTE — Progress Notes (Signed)
Kimberly Cummings, is a 23 y.o. female  XBO:[id number]  SHN:[id number]  DOB - 04-12-1996  CC:  Chief Complaint  Patient presents with  . Establish Care  . Follow-up    ED 3/8: atypical chest pain       HPI: Kimberly Cummings is a 23 y.o. female is here today to establish care.   Riddhi Jurney has Posttraumatic stress disorder; Atypical chest pain; Dyslipidemia; Snoring; and Obesity (BMI 30-39.9) on their problem list.   Kimberly Cummings presented to [facility] emergency department on 07/22/2018 complaint of chest pain and shortness of breath.  She describes the pain as sharp and she also endorsed palpitations and fluttering in her chest.  Patient has had intermittent atypical chest pain over the last 2 years. She has had a complete cardiovascular work-up including a stress test and echocardiogram which were all unremarkable. They patient reports chest pain is continuing to occur mostly every day. The pain is located mid sternum. 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 She denies any associated acid reflux symptoms.  She is unable to identify an activity that produces the chest pain. She is no longer having the shortness of breath with the chest pain it just comes and goes. The pain has not worsened is remained at the same rate throughout the last couple years. She denies caffeine usage. During recent ER visit, she was found to have a positive d dimer with negative CT angiogram.  Patient denies any major trauma to her sternal wall region with the exception after suffering a head injury she did undergo a sternal rub In 2017 or 2018. She has never tried any anti-inflammatories consistently to determine whether or not the pain will resolve.  She denies syncope, dizziness, blurring of vision or headache. She is currently prescribed oral contraceptive pill .She has no history of clotting disorder or blood clots in spite of an elevated D dimer.  Current Body mass index is  38.85 kg/m. Patient endorses a desire to loose weight. She is making lifestyle changes such as changing dietary intake and intermittent fasting. She has successfully lost some weight since starting this new regimen. She is hydrating well with water and plans to incorporate physical activity into her lifestyle.   Current medications: Current Outpatient Medications:  .  clobetasol cream (TEMOVATE) 0.05 %, Apply 1 application topically daily as needed (psoriasis). , Disp: , Rfl:  .  ibuprofen (ADVIL,MOTRIN) 200 MG tablet, Take 800 mg by mouth every 6 (six) hours as needed for headache., Disp: , Rfl:  .  LAYOLIS FE 0.8-25 MG-MCG tablet, Chew 1 tablet by mouth every evening., Disp: , Rfl:    Pertinent family medical history: family history includes Hypertension in her father.   No Known Allergies  Social History   Socioeconomic History  . Marital status: Single    Spouse name: Not on file  . Number of children: Not on file  . Years of education: Not on file  . Highest education level: Not on file  Occupational History  . Not on file  Social Needs  . Financial resource strain: Not on file  . Food insecurity:    Worry: Not on file    Inability: Not on file  . Transportation needs:    Medical: Not on file    Non-medical: Not on file  Tobacco Use  . Smoking status: Never Smoker  . Smokeless tobacco: Never Used  Substance and Sexual Activity  . Alcohol use: Yes  Comment: occas.   . Drug use: Never  . Sexual activity: Not on file  Lifestyle  . Physical activity:    Days per week: Not on file    Minutes per session: Not on file  . Stress: Not on file  Relationships  . Social connections:    Talks on phone: Not on file    Gets together: Not on file    Attends religious service: Not on file    Active member of club or organization: Not on file    Attends meetings of clubs or organizations: Not on file    Relationship status: Not on file  . Intimate partner violence:    Fear  of current or ex partner: Not on file    Emotionally abused: Not on file    Physically abused: Not on file    Forced sexual activity: Not on file  Other Topics Concern  . Not on file  Social History Narrative  . Not on file    Review of Systems: Pertinent negatives listed in HPI Objective:   Vitals:   08/10/18 1056  BP: 118/79  Pulse: 85  Resp: 17  Temp: 98.7 F (37.1 C)  SpO2: 96%    BP Readings from Last 3 Encounters:  08/10/18 118/79  07/22/18 110/71  10/11/17 102/66    Filed Weights   08/10/18 1056  Weight: 212 lb 6.4 oz (96.3 kg)      Physical Exam: General appearance: alert, well developed, well nourished, cooperative and in no distress Head: Normocephalic, without obvious abnormality, atraumatic Respiratory: Respirations even and unlabored, normal respiratory rate Heart: rate and rhythm normal. No gallop or murmurs noted on exam  Extremities: No gross deformities Skin: Skin color, texture, turgor normal. No rashes seen  Psych: Appropriate mood and affect. Neurologic: Mental status: Alert, oriented to person, place, and time, thought content appropriate.  Lab Results (prior encounters)  Lab Results  Component Value Date   WBC 7.7 07/22/2018   HGB 14.0 07/22/2018   HCT 45.3 07/22/2018   MCV 90.2 07/22/2018   PLT 260 07/22/2018   Lab Results  Component Value Date   CREATININE 0.91 07/22/2018   BUN 8 07/22/2018   NA 139 07/22/2018   K 3.6 07/22/2018   CL 107 07/22/2018   CO2 24 07/22/2018      Assessment and plan:  1. Encounter to establish care  2. Screening for blood or protein in urine - POCT URINALYSIS DIP (CLINITEK), unremarkable - POCT urine pregnancy, negative  3. Atypical chest pain -Recommended a trial of Meloxicam 15 mg once daily as needed for what I suspect maybe  Sternal musculoskeletal pain   Return in about 4 months (around 12/10/2018) for cpe. She is established with OB/GYN-w/o PAP   The patient was given clear  instructions to go to ER or return to medical center if symptoms don't improve, worsen or new problems develop. The patient verbalized understanding. The patient was advised  to call and obtain lab results if they haven't heard anything from out office within 7-10 business days.  Joaquin Courts, FNP Primary Care at [facility] [address], [place] [address] [phone]: [phone]    This note has been created with Dragon speech recognition software and Paediatric nurse. Any transcriptional errors are unintentional.

## 2018-08-10 NOTE — Patient Instructions (Addendum)
Thank you for choosing Primary Care at [facility] to be your medical home!    Kimberly Cummings was seen by Joaquin Courts, FNP today.   Kimberly Cummings's primary care provider is Patient, No Pcp Per.   For the best care possible, you should try to see Joaquin Courts, FNP-C whenever you come to the clinic.   We look forward to seeing you again soon!  If you have any questions about your visit today, please call us at [phone] or feel free to reach your primary care provider via MyChart.      Levonorgestrel intrauterine device (IUD) What is this medicine? LEVONORGESTREL IUD (LEE voe nor jes trel) is a contraceptive (birth control) device. The device is placed inside the uterus by a healthcare professional. It is used to prevent pregnancy. This device can also be used to treat heavy bleeding that occurs during your period. This medicine may be used for other purposes; ask your health care provider or pharmacist if you have questions. COMMON BRAND NAME(S): Cameron Ali What should I tell my health care provider before I take this medicine? They need to know if you have any of these conditions: -abnormal Pap smear -cancer of the breast, uterus, or cervix -diabetes -endometritis -genital or pelvic infection now or in the past -have more than one sexual partner or your partner has more than one partner -heart disease -history of an ectopic or tubal pregnancy -immune system problems -IUD in place -liver disease or tumor -problems with blood clots or take blood-thinners -seizures -use intravenous drugs -uterus of unusual shape -vaginal bleeding that has not been explained -an unusual or allergic reaction to levonorgestrel, other hormones, silicone, or polyethylene, medicines, foods, dyes, or preservatives -pregnant or trying to get pregnant -breast-feeding How should I use this medicine? This device is placed inside the uterus by a health care  professional. Talk to your pediatrician regarding the use of this medicine in children. Special care may be needed. Overdosage: If you think you have taken too much of this medicine contact a poison control center or emergency room at once. NOTE: This medicine is only for you. Do not share this medicine with others. What if I miss a dose? This does not apply. Depending on the brand of device you have inserted, the device will need to be replaced every 3 to 5 years if you wish to continue using this type of birth control. What may interact with this medicine? Do not take this medicine with any of the following medications: -amprenavir -bosentan -fosamprenavir This medicine may also interact with the following medications: -aprepitant -armodafinil -barbiturate medicines for inducing sleep or treating seizures -bexarotene -boceprevir -griseofulvin -medicines to treat seizures like carbamazepine, ethotoin, felbamate, oxcarbazepine, phenytoin, topiramate -modafinil -pioglitazone -rifabutin -rifampin -rifapentine -some medicines to treat HIV infection like atazanavir, efavirenz, indinavir, lopinavir, nelfinavir, tipranavir, ritonavir -St. John's wort -warfarin This list may not describe all possible interactions. Give your health care provider a list of all the medicines, herbs, non-prescription drugs, or dietary supplements you use. Also tell them if you smoke, drink alcohol, or use illegal drugs. Some items may interact with your medicine. What should I watch for while using this medicine? Visit your doctor or health care professional for regular check ups. See your doctor if you or your partner has sexual contact with others, becomes HIV positive, or gets a sexual transmitted disease. This product does not protect you against HIV infection (AIDS) or other sexually transmitted diseases. You can check the  placement of the IUD yourself by reaching up to the top of your vagina with clean  fingers to feel the threads. Do not pull on the threads. It is a good habit to check placement after each menstrual period. Call your doctor right away if you feel more of the IUD than just the threads or if you cannot feel the threads at all. The IUD may come out by itself. You may become pregnant if the device comes out. If you notice that the IUD has come out use a backup birth control method like condoms and call your health care provider. Using tampons will not change the position of the IUD and are okay to use during your period. This IUD can be safely scanned with magnetic resonance imaging (MRI) only under specific conditions. Before you have an MRI, tell your healthcare provider that you have an IUD in place, and which type of IUD you have in place. What side effects may I notice from receiving this medicine? Side effects that you should report to your doctor or health care professional as soon as possible: -allergic reactions like skin rash, itching or hives, swelling of the face, lips, or tongue -fever, flu-like symptoms -genital sores -high blood pressure -no menstrual period for 6 weeks during use -pain, swelling, warmth in the leg -pelvic pain or tenderness -severe or sudden headache -signs of pregnancy -stomach cramping -sudden shortness of breath -trouble with balance, talking, or walking -unusual vaginal bleeding, discharge -yellowing of the eyes or skin Side effects that usually do not require medical attention (report to your doctor or health care professional if they continue or are bothersome): -acne -breast pain -change in sex drive or performance -changes in weight -cramping, dizziness, or faintness while the device is being inserted -headache -irregular menstrual bleeding within first 3 to 6 months of use -nausea This list may not describe all possible side effects. Call your doctor for medical advice about side effects. You may report side effects to FDA at  1-800-FDA-1088. Where should I keep my medicine? This does not apply. NOTE: This sheet is a summary. It may not cover all possible information. If you have questions about this medicine, talk to your doctor, pharmacist, or health care provider.  2019 Elsevier/Gold Standard (2016-02-12 14:14:56)

## 2018-12-07 ENCOUNTER — Telehealth: Payer: Self-pay

## 2018-12-07 NOTE — Telephone Encounter (Signed)
Called patient to do their pre-visit COVID screening.  Call went to voicemail. Unable to do prescreening.  

## 2018-12-10 ENCOUNTER — Other Ambulatory Visit (HOSPITAL_COMMUNITY)
Admission: RE | Admit: 2018-12-10 | Discharge: 2018-12-10 | Disposition: A | Discharge status: Home / Self Care | Payer: Managed Care, Other (non HMO) | Source: Ambulatory Visit | Attending: Family Medicine | Admitting: Family Medicine

## 2018-12-10 ENCOUNTER — Encounter: Payer: Self-pay | Admitting: Family Medicine

## 2018-12-10 ENCOUNTER — Ambulatory Visit (INDEPENDENT_AMBULATORY_CARE_PROVIDER_SITE_OTHER): Payer: Managed Care, Other (non HMO) | Admitting: Family Medicine

## 2018-12-10 ENCOUNTER — Other Ambulatory Visit: Payer: Self-pay

## 2018-12-10 VITALS — BP 115/77 | HR 61 | Temp 97.5°F | Resp 17 | Ht 62.0 in | Wt 204.8 lb

## 2018-12-10 DIAGNOSIS — N898 Other specified noninflammatory disorders of vagina: Secondary | ICD-10-CM | POA: Insufficient documentation

## 2018-12-10 DIAGNOSIS — Z13220 Encounter for screening for lipoid disorders: Secondary | ICD-10-CM

## 2018-12-10 DIAGNOSIS — Z1389 Encounter for screening for other disorder: Secondary | ICD-10-CM | POA: Diagnosis not present

## 2018-12-10 DIAGNOSIS — Z113 Encounter for screening for infections with a predominantly sexual mode of transmission: Secondary | ICD-10-CM

## 2018-12-10 DIAGNOSIS — Z114 Encounter for screening for human immunodeficiency virus [HIV]: Secondary | ICD-10-CM

## 2018-12-10 DIAGNOSIS — G8929 Other chronic pain: Secondary | ICD-10-CM

## 2018-12-10 DIAGNOSIS — Z0001 Encounter for general adult medical examination with abnormal findings: Secondary | ICD-10-CM | POA: Diagnosis not present

## 2018-12-10 DIAGNOSIS — Z131 Encounter for screening for diabetes mellitus: Secondary | ICD-10-CM | POA: Diagnosis not present

## 2018-12-10 DIAGNOSIS — M549 Dorsalgia, unspecified: Secondary | ICD-10-CM

## 2018-12-10 DIAGNOSIS — R82998 Other abnormal findings in urine: Secondary | ICD-10-CM | POA: Diagnosis not present

## 2018-12-10 DIAGNOSIS — Z0000 Encounter for general adult medical examination without abnormal findings: Secondary | ICD-10-CM

## 2018-12-10 DIAGNOSIS — Z124 Encounter for screening for malignant neoplasm of cervix: Secondary | ICD-10-CM

## 2018-12-10 LAB — POCT URINALYSIS DIP (CLINITEK)
Bilirubin, UA: NEGATIVE
Blood, UA: NEGATIVE
Glucose, UA: NEGATIVE mg/dL
Ketones, POC UA: NEGATIVE mg/dL
Nitrite, UA: NEGATIVE
POC PROTEIN,UA: NEGATIVE
Spec Grav, UA: >=1.03 — AB
Urobilinogen, UA: 0.2 U/dL
pH, UA: 5.5

## 2018-12-10 LAB — POCT CBG (FASTING - GLUCOSE)-MANUAL ENTRY: Glucose Fasting, POC: 83 mg/dL (ref 70–99)

## 2018-12-10 MED ORDER — SULFAMETHOXAZOLE-TRIMETHOPRIM 800-160 MG PO TABS: 1.0000 | ORAL_TABLET | Freq: Two times a day (BID) | ORAL | 0 refills | Status: AC

## 2018-12-10 MED ORDER — CLOBETASOL PROPIONATE 0.05 % EX CREA: 1.0000 "application " | TOPICAL_CREAM | Freq: Every day | CUTANEOUS | 6 refills | Status: DC | PRN

## 2018-12-10 NOTE — Patient Instructions (Addendum)
Preventive Care 28-22 Years Old, Female Preventive care refers to visits with your health care provider and lifestyle choices that can promote health and wellness. This includes:  A yearly physical exam. This may also be called an annual well check.  Regular dental visits and eye exams.  Immunizations.  Screening for certain conditions.  Healthy lifestyle choices, such as eating a healthy diet, getting regular exercise, not using drugs or products that contain nicotine and tobacco, and limiting alcohol use. What can I expect for my preventive care visit? Physical exam Your health care provider will check your:  Height and weight. This may be used to calpainculate body mass index (BMI), which tells if you are at a healthy weight.  Heart rate and blood pressure.  Skin for abnormal spots. Counseling Your health care provider may ask you questions about your:  Alcohol, tobacco, and drug use.  Emotional well-being.  Home and relationship well-being.  Sexual activity.  Eating habits.  Work and work Statistician.  Method of birth control.  Menstrual cycle.  Pregnancy history. What immunizations do I need?  Influenza (flu) vaccine  This is recommended every year. Tetanus, diphtheria, and pertussis (Tdap) vaccine  You may need a Td booster every 10 years. Varicella (chickenpox) vaccine  You may need this if you have not been vaccinated. Human papillomavirus (HPV) vaccine  If recommended by your health care provider, you may need three doses over 6 months. Measles, mumps, and rubella (MMR) vaccine  You may need at least one dose of MMR. You may also need a second dose. Meningococcal conjugate (MenACWY) vaccine  One dose is recommended if you are age 62-21 years and a first-year college student living in a residence hall, or if you have one of several medical conditions. You may also need additional booster doses. Pneumococcal conjugate (PCV13) vaccine  You may need  this if you have certain conditions and were not previously vaccinated. Pneumococcal polysaccharide (PPSV23) vaccine  You may need one or two doses if you smoke cigarettes or if you have certain conditions. Hepatitis A vaccine  You may need this if you have certain conditions or if you travel or work in places where you may be exposed to hepatitis A. Hepatitis B vaccine  You may need this if you have certain conditions or if you travel or work in places where you may be exposed to hepatitis B. Haemophilus influenzae type b (Hib) vaccine  You may need this if you have certain conditions. You may receive vaccines as individual doses or as more than one vaccine together in one shot (combination vaccines). Talk with your health care provider about the risks and benefits of combination vaccines. What tests do I need?  Blood tests  Lipid and cholesterol levels. These may be checked every 5 years starting at age 36.  Hepatitis C test.  Hepatitis B test. Screening  Diabetes screening. This is done by checking your blood sugar (glucose) after you have not eaten for a while (fasting).  Sexually transmitted disease (STD) testing.  BRCA-related cancer screening. This may be done if you have a family history of breast, ovarian, tubal, or peritoneal cancers.  Pelvic exam and Pap test. This may be done every 3 years starting at age 53. Starting at age 37, this may be done every 5 years if you have a Pap test in combination with an HPV test. Talk with your health care provider about your test results, treatment options, and if necessary, the need for more tests.  Follow these instructions at home: Eating and drinking   Eat a diet that includes fresh fruits and vegetables, whole grains, lean protein, and low-fat dairy.  Take vitamin and mineral supplements as recommended by your health care provider.  Do not drink alcohol if: ? Your health care provider tells you not to drink. ? You are  pregnant, may be pregnant, or are planning to become pregnant.  If you drink alcohol: ? Limit how much you have to 0-1 drink a day. ? Be aware of how much alcohol is in your drink. In the [place] one drink equals one 12 oz bottle of beer (355 mL), one 5 oz glass of wine (148 mL), or one 1 oz glass of hard liquor (44 mL). Lifestyle  Take daily care of your teeth and gums.  Stay active. Exercise for at least 30 minutes on 5 or more days each week.  Do not use any products that contain nicotine or tobacco, such as cigarettes, e-cigarettes, and chewing tobacco. If you need help quitting, ask your health care provider.  If you are sexually active, practice safe sex. Use a condom or other form of birth control (contraception) in order to prevent pregnancy and STIs (sexually transmitted infections). If you plan to become pregnant, see your health care provider for a preconception visit. What's next?  Visit your health care provider once a year for a well check visit.  Ask your health care provider how often you should have your eyes and teeth checked.  Stay up to date on all vaccines. This information is not intended to replace advice given to you by your health care provider. Make sure you discuss any questions you have with your health care provider. Document Released: 06/28/2001 Document Revised: 01/11/2018 Document Reviewed: 01/11/2018 Elsevier Patient Education  2020 Elsevier Inc.  

## 2018-12-10 NOTE — Progress Notes (Signed)
Established Patient Office Visit  Subjective:  Patient ID: Kimberly Cummings, female    DOB: Mar 04, 1996  Age: 23 y.o. MRN: [id number]  CC:  Chief Complaint  Patient presents with  . Annual Exam    HPI Kimberly Cummings presents for annual well exam.  She reports that she has had no prior Pap smear.  She was on birth control pills but stopped these about 3 weeks ago about the same time as her last.  At this time, she is using condoms for pregnancy prevention.  She denies any pelvic pain and has noticed no vaginal discharge at this time.  Patient states that overall she feels well.  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  She believes that her immunizations are currently up-to-date as she believes that her workplace keeps her up-to-date on her immunizations and she works at a dialysis center.  She denies any issues at today's visit other than having chronic mid to low back pain.  She reports that her pain when it occurs is about a 5-6 on a 0-to-10 scale.  Pain is relieved with the use of over-the-counter pain medications.  Patient also has psoriasis and requests refill of clobetasol.  She does have an upcoming dermatology appointment in follow-up.  Past Medical History:  Diagnosis Date  . Overweight   . Psoriasis   . PTSD (post-traumatic stress disorder)     Past Surgical History:  Procedure Laterality Date  . NO PAST SURGERIES      Family History  Problem Relation Age of Onset  . Hyperlipidemia Mother   . Hypertension Father   . Stroke Neg Hx   . Heart attack Neg Hx     Social History   Socioeconomic History  . Marital status: Single    Spouse name: Not on file  . Number of children: Not on file  . Years of education: Not on file  . Highest education level: Not on file  Occupational History  . Not on file  Social Needs  . Financial resource strain: Not on file  . Food insecurity   Worry: Not on file    Inability: Not on file  . Transportation needs    Medical: Not on file    Non-medical: Not on file  Tobacco Use  . Smoking status: Never Smoker  . Smokeless tobacco: Never Used  Substance and Sexual Activity  . Alcohol use: Yes    Comment: occas.   . Drug use: Never  . Sexual activity: Not on file  Lifestyle  . Physical activity    Days per week: Not on file    Minutes per session: Not on file  . Stress: Not on file  Relationships  . Social Musicianconnections    Talks on phone: Not on file    Gets together: Not on file    Attends religious service: Not on file    Active member of club or organization: Not on file    Attends meetings of clubs or organizations: Not on file    Relationship status: Not on file  . Intimate partner violence    Fear of current or ex partner: Not on file    Emotionally abused: Not on file    Physically abused: Not on file    Forced sexual activity: Not on file  Other Topics Concern  . Not on file  Social History Narrative  . Not on  file    Outpatient Medications Prior to Visit  Medication Sig Dispense Refill  . clobetasol cream (TEMOVATE) 0.05 % Apply 1 application topically daily as needed (psoriasis).     Marland Kitchen. ibuprofen (ADVIL,MOTRIN) 200 MG tablet Take 800 mg by mouth every 6 (six) hours as needed for headache.    Marland Kitchen. LAYOLIS FE 0.8-25 MG-MCG tablet Chew 1 tablet by mouth every evening.    . meloxicam (MOBIC) 15 MG tablet Take 1 tablet (15 mg total) by mouth daily. 30 tablet 1   No facility-administered medications prior to visit.     No Known Allergies  ROS Review of Systems  Constitutional: Negative for chills, fatigue and fever.  HENT: Negative for congestion, sore throat and trouble swallowing.   Eyes: Negative for photophobia and visual disturbance.  Respiratory: Negative for cough and shortness of breath.   Gastrointestinal: Negative for abdominal pain, blood in stool, constipation, diarrhea and nausea.  Endocrine:  Negative for cold intolerance, heat intolerance, polydipsia, polyphagia and polyuria.  Genitourinary: Negative for dysuria and frequency.  Musculoskeletal: Positive for back pain. Negative for arthralgias and gait problem.  Skin: Positive for rash (psoriasis). Negative for wound.  Neurological: Negative for dizziness and headaches.  Hematological: Negative for adenopathy. Does not bruise/bleed easily.      Objective:    Physical Exam  Constitutional: She is oriented to person, place, and time. She appears well-developed and well-nourished.  Overweight for height/obese young adult female in NAD  Neck: Normal range of motion. Neck supple. No JVD present. No thyromegaly present.  Cardiovascular: Normal rate and regular rhythm.  Pulmonary/Chest: Effort normal and breath sounds normal. No breast swelling, tenderness or discharge.  Abdominal: Soft. There is no abdominal tenderness. There is no rebound and no guarding.  Genitourinary:    Vaginal discharge present.     No vaginal erythema, tenderness or bleeding.  No erythema, tenderness or bleeding in the vagina.    Genitourinary Comments: Patient has some erythema of the cervix with collection of pap and she has an area of skin at about 10 o'clock on the cervix that is whitish in appearance. Mucoid discharge at the opening of the cervix and some scant whitish discharge in the vaginal vault/canal   Musculoskeletal:        General: No tenderness or edema.  Lymphadenopathy:    She has no cervical adenopathy.  Neurological: She is alert and oriented to person, place, and time.  Skin: Skin is warm and dry. Rash noted.  Patient with psoriatic plaques below the knees and scattered on the lower legs and scattered plaques on the arms  Psychiatric: She has a normal mood and affect. Her behavior is normal. Judgment and thought content normal.  Nursing note and vitals reviewed.   Ht 5\' 2"  (1.575 m)   BMI 38.85 kg/m  Wt Readings from Last 3  Encounters:  08/10/18 212 lb 6.4 oz (96.3 kg)  07/22/18 210 lb (95.3 kg)  12/18/17 215 lb (97.5 kg)     Health Maintenance Due  Topic Date Due  . HIV Screening  09/05/2011  . PAP-Cervical Cytology Screening  09/04/2017  . PAP SMEAR-Modifier  09/04/2017    Lab Results  Component Value Date   TSH 0.826 07/15/2010   Lab Results  Component Value Date   WBC 7.7 07/22/2018   HGB 14.0 07/22/2018   HCT 45.3 07/22/2018   MCV 90.2 07/22/2018   PLT 260 07/22/2018   Lab Results  Component Value Date   NA 139 07/22/2018  K 3.6 07/22/2018   CO2 24 07/22/2018   GLUCOSE 100 (H) 07/22/2018   BUN 8 07/22/2018   CREATININE 0.91 07/22/2018   CALCIUM 9.7 07/22/2018   ANIONGAP 8 07/22/2018   No results found for: CHOL No results found for: HDL No results found for: LDLCALC No results found for: TRIG No results found for: CHOLHDL No results found for: HGBA1C    Assessment & Plan:  1. Annual physical exam; screening for cervical cancer; screening for diabetes; screening for lipid disorders; screening for HIV Patient had Pap smear today's visit as part of her annual well exam as a screening test for cervical cancer.  Patient also agreed to have screening for HIV.  She will also have fasting glucose as a screening test for diabetes and lipid panel as a screening test for lipid disorders patient was given handout/anticipatory guidance regarding preventative care for 65-61 year old females.  Healthy diet and regular exercise encouraged.  Immunizations discussed.  Patient will contact her employer to see if her immunizations are up-to-date and she is asked to bring/send copy of immunizations to this office.  Discussed with patient also that her BMI places her in the category of overweight and low-fat diet and regular exercise encouraged. - Cytology - PAP([facility]) - HIV antibody (with reflex) - Glucose (CBG), Fasting -Lipid Panel  6. Screening for blood or protein in urine Patient had  urinalysis as a screening for blood or protein in the urine. - POCT URINALYSIS DIP (CLINITEK)  7. Vaginal discharge Patient with presence of some vaginal discharge on exam.  Patient initially did not wish to have testing for STIs therefore sample was not obtained during during collection for Pap smear but later patient requested testing for STIs therefore patient did self swab for collection of sample for cervicovaginal ancillary.  She will be notified of the results and if any further treatment is needed based on these results. - Cervicovaginal ancillary only  8. Leukocytes in urine Patient with presence of leukocytes in the urine and patient did have some mild suprapubic discomfort on examination.  Prescription provided for Bactrim DS twice daily x3 days for treatment of urinary tract infection. - sulfamethoxazole-trimethoprim (BACTRIM DS) 800-160 MG tablet; Take 1 tablet by mouth 2 (two) times daily for 3 days.  Dispense: 66 tablet; Refill: 0  9. Chronic midline back , unspecified back location Patient with complaint of chronic issues with midline low back pain.  On review of chart, she had mentioned this on prior visits.  Order placed for patient to have x-rays of the thoracic and lumbar spine though I discussed with the patient that her pain is most likely musculoskeletal in nature and she may take over-the-counter nonsteroidals as needed and use warm moist heat to the affected area to help with back pain. - DG Thoracic Spine W/Swimmers; Future - DG Lumbar Spine Complete; Future  An After Visit Summary was printed and given to the patient.  Follow-up: Return for chronic issues; as needed and 4-6 months.   Antony Blackbird, MD

## 2018-12-11 LAB — LIPID PANEL
Chol/HDL Ratio: 4.8 ratio — ABNORMAL HIGH (ref 0.0–4.4)
Cholesterol, Total: 252 mg/dL — ABNORMAL HIGH (ref 100–199)
HDL: 52 mg/dL
LDL Calculated: 175 mg/dL — ABNORMAL HIGH (ref 0–99)
Triglycerides: 124 mg/dL (ref 0–149)
VLDL Cholesterol Cal: 25 mg/dL (ref 5–40)

## 2018-12-11 LAB — HIV ANTIBODY (ROUTINE TESTING W REFLEX): HIV Screen 4th Generation wRfx: NONREACTIVE

## 2018-12-12 LAB — CERVICOVAGINAL ANCILLARY ONLY
Bacterial vaginitis: NEGATIVE
Candida vaginitis: NEGATIVE
Chlamydia: NEGATIVE
Neisseria Gonorrhea: NEGATIVE
Trichomonas: NEGATIVE

## 2018-12-13 LAB — CYTOLOGY - PAP: Diagnosis: NEGATIVE

## 2018-12-17 ENCOUNTER — Encounter: Payer: Self-pay | Admitting: Family Medicine

## 2018-12-18 ENCOUNTER — Ambulatory Visit (HOSPITAL_COMMUNITY): Payer: Managed Care, Other (non HMO)

## 2018-12-18 ENCOUNTER — Other Ambulatory Visit: Payer: Self-pay

## 2018-12-18 ENCOUNTER — Ambulatory Visit (HOSPITAL_COMMUNITY)
Admission: RE | Admit: 2018-12-18 | Discharge: 2018-12-18 | Disposition: A | Discharge status: Home / Self Care | Payer: Managed Care, Other (non HMO) | Source: Ambulatory Visit | Attending: Family Medicine | Admitting: Family Medicine

## 2018-12-18 DIAGNOSIS — M549 Dorsalgia, unspecified: Secondary | ICD-10-CM | POA: Insufficient documentation

## 2018-12-18 DIAGNOSIS — G8929 Other chronic pain: Secondary | ICD-10-CM | POA: Diagnosis present

## 2018-12-24 NOTE — Progress Notes (Signed)
Patient notified of results & recommendations. Expressed understanding.

## 2019-06-12 ENCOUNTER — Ambulatory Visit: Payer: Self-pay

## 2019-06-13 ENCOUNTER — Other Ambulatory Visit: Payer: Self-pay

## 2019-06-13 ENCOUNTER — Ambulatory Visit (INDEPENDENT_AMBULATORY_CARE_PROVIDER_SITE_OTHER): Payer: Managed Care, Other (non HMO) | Admitting: Family Medicine

## 2019-06-13 DIAGNOSIS — F419 Anxiety disorder, unspecified: Secondary | ICD-10-CM | POA: Diagnosis not present

## 2019-06-13 NOTE — Progress Notes (Signed)
Patient verified DOB Patient has taken no medication today and patient has not eaten yet. 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

## 2019-06-13 NOTE — Progress Notes (Signed)
Virtual Visit via Telephone Note  I connected with Kimberly Cummings, on 06/13/2019 at 8:33 AM by telephone due to the COVID-19 pandemic and verified that I am speaking with the correct person using two identifiers.   Consent: I discussed the limitations, risks, security and privacy concerns of performing an evaluation and management service by telephone and the availability of in person appointments. I also discussed with the patient that there may be a patient responsible charge related to this service. The patient expressed understanding and agreed to proceed.   Location of Patient: Home  Location of Provider: Clinic   Persons participating in Telemedicine visit: Ascencion Stegner [place]-CMA Dr. Alvis Lemmings     History of Present Illness: Kimberly Cummings is a 24 year old female seen for an office visit.   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 While in [place] she was on Celexa and received therapy at the time but has not been on anything. Her Anxiety is intermittent  She has noticed some spotting after sexual activity and no spotting on other times. Prior to this her last relationship was in 2012  Past Medical History:  Diagnosis Date  . Overweight   . Psoriasis   . PTSD (post-traumatic stress disorder)    Allergies  Allergen Reactions  . Citrus Swelling    Tongue swelling with citrus fruits    Current Outpatient Medications on File Prior to Visit  Medication Sig Dispense Refill  . clobetasol cream (TEMOVATE) 0.05 % Apply 1 application topically daily as needed (psoriasis). 60 g 6  . ibuprofen (ADVIL,MOTRIN) 200 MG tablet Take 800 mg by mouth every 6 (six) hours as needed for headache.     No current facility-administered  medications on file prior to visit.    Observations/Objective: Alert, awake, oriented x3 Not in acute distress  Assessment and Plan: 1. Anxiety Worsened by underlying Pandemic Will try Pschotherapy - referred to LCSW She would like to hold off on medications at this time If symptoms persist at next visit consider initiation of Celexa We have discussed coping mechanisms including Yoga, meditation. She will also work towards seeing her family while outside the home with them inside since they do not have video call capabilities.   Follow Up Instructions: Return for therapy with Jasmine; 6 weeks for f/u on Anxiety.    I discussed the assessment and treatment plan with the patient. The patient was provided an opportunity to ask questions and all were answered. The patient agreed with the plan and demonstrated an understanding of the instructions.   The patient was advised to call back or seek an in-person evaluation if the symptoms worsen or if the condition fails to improve as anticipated.     I provided 13 minutes total of non-face-to-face time during this encounter including median intraservice time, reviewing previous notes, investigations, ordering medications, medical decision making, coordinating care and patient verbalized understanding at the end of the visit.     Hoy Register, MD, FAAFP. [facility] [place], [place] [phone]   06/13/2019, 8:33 AM

## 2019-07-17 ENCOUNTER — Encounter: Payer: Self-pay | Admitting: Family Medicine

## 2019-07-29 ENCOUNTER — Telehealth: Payer: Self-pay

## 2019-07-29 NOTE — Telephone Encounter (Signed)
Called patient to do their pre-visit COVID screening.  Call went to voicemail. Unable to do prescreening.  

## 2019-07-30 ENCOUNTER — Ambulatory Visit (INDEPENDENT_AMBULATORY_CARE_PROVIDER_SITE_OTHER): Payer: Self-pay | Admitting: Licensed Clinical Social Worker

## 2019-07-30 ENCOUNTER — Other Ambulatory Visit: Payer: Self-pay

## 2019-07-30 ENCOUNTER — Ambulatory Visit (INDEPENDENT_AMBULATORY_CARE_PROVIDER_SITE_OTHER): Payer: Managed Care, Other (non HMO) | Admitting: Family

## 2019-07-30 VITALS — BP 103/72 | HR 58 | Temp 97.5°F | Resp 17 | Wt 207.0 lb

## 2019-07-30 DIAGNOSIS — F419 Anxiety disorder, unspecified: Secondary | ICD-10-CM

## 2019-07-30 DIAGNOSIS — F4310 Post-traumatic stress disorder, unspecified: Secondary | ICD-10-CM
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
## 2019-07-30 NOTE — Patient Instructions (Addendum)
Follow-up with primary provider in 1 month or sooner if symptoms do not resolve or worsen. Generalized Anxiety Disorder, Adult Generalized anxiety disorder (GAD) is a mental health disorder. People with this condition constantly worry about everyday events. Unlike normal anxiety, worry related to GAD is not triggered by a specific event. These worries also do not fade or get better with time. GAD interferes with life functions, including relationships, work, and school. GAD can vary from mild to severe. People with severe GAD can have intense waves of anxiety with physical symptoms (panic attacks). What are the causes? The exact cause of GAD is not known. What increases the risk? This condition is more likely to develop in:  Women.  People who have a family history of anxiety disorders.  People who are very shy.  People who experience very stressful life events, such as the death of a loved one.  People who have a very stressful family environment. What are the signs or symptoms? People with GAD often worry excessively about many things in their lives, such as their health and family. They may also be overly concerned about:  Doing well at work.  Being on time.  Natural disasters.  Friendships. Physical symptoms of GAD include:  Fatigue.  Muscle tension or having muscle twitches.  Trembling or feeling shaky.  Being easily startled.  Feeling like your heart is pounding or racing.  Feeling out of breath or like you cannot take a deep breath.  Having trouble falling asleep or staying asleep.  Sweating.  Nausea, diarrhea, or irritable bowel syndrome (IBS).  Headaches.  Trouble concentrating or remembering facts.  Restlessness.  Irritability. How is this diagnosed? Your health care provider can diagnose GAD based on your symptoms and medical history. You will also have a physical exam. The health care provider will ask specific questions about your symptoms,  including how severe they are, when they started, and if they come and go. Your health care provider may ask you about your use of alcohol or drugs, including prescription medicines. Your health care provider may refer you to a mental health specialist for further evaluation. Your health care provider will do a thorough examination and may perform additional tests to rule out other possible causes of your symptoms. To be diagnosed with GAD, a person must have anxiety that:  Is out of his or her control.  Affects several different aspects of his or her life, such as work and relationships.  Causes distress that makes him or her unable to take part in normal activities.  Includes at least three physical symptoms of GAD, such as restlessness, fatigue, trouble concentrating, irritability, muscle tension, or sleep problems. Before your health care provider can confirm a diagnosis of GAD, these symptoms must be present more days than they are not, and they must last for six months or longer. How is this treated? The following therapies are usually used to treat GAD:  Medicine. Antidepressant medicine is usually prescribed for long-term daily control. Antianxiety medicines may be added in severe cases, especially when panic attacks occur.  Talk therapy (psychotherapy). Certain types of talk therapy can be helpful in treating GAD by providing support, education, and guidance. Options include: ? Cognitive behavioral therapy (CBT). People learn coping skills and techniques to ease their anxiety. They learn to identify unrealistic or negative thoughts and behaviors and to replace them with positive ones. ? Acceptance and commitment therapy (ACT). This treatment teaches people how to be mindful as a way to cope  with unwanted thoughts and feelings. ? Biofeedback. This process trains you to manage your body's response (physiological response) through breathing techniques and relaxation methods. You will work  with a therapist while machines are used to monitor your physical symptoms.  Stress management techniques. These include yoga, meditation, and exercise. A mental health specialist can help determine which treatment is best for you. Some people see improvement with one type of therapy. However, other people require a combination of therapies. Follow these instructions at home:  Take over-the-counter and prescription medicines only as told by your health care provider.  Try to maintain a normal routine.  Try to anticipate stressful situations and allow extra time to manage them.  Practice any stress management or self-calming techniques as taught by your health care provider.  Do not punish yourself for setbacks or for not making progress.  Try to recognize your accomplishments, even if they are small.  Keep all follow-up visits as told by your health care provider. This is important. Contact a health care provider if:  Your symptoms do not get better.  Your symptoms get worse.  You have signs of depression, such as: ? A persistently sad, cranky, or irritable mood. ? Loss of enjoyment in activities that used to bring you joy. ? Change in weight or eating. ? Changes in sleeping habits. ? Avoiding friends or family members. ? Loss of energy for normal tasks. ? Feelings of guilt or worthlessness. Get help right away if:  You have serious thoughts about hurting yourself or others. If you ever feel like you may hurt yourself or others, or have thoughts about taking your own life, get help right away. You can go to your nearest emergency department or call:  Your local emergency services (911 in the [place]  A suicide crisis helpline, such as the [facility] at 332-427-9899. This is open 24 hours a day. Summary  Generalized anxiety disorder (GAD) is a mental health disorder that involves worry that is not triggered by a specific event.  People with GAD  often worry excessively about many things in their lives, such as their health and family.  GAD may cause physical symptoms such as restlessness, trouble concentrating, sleep problems, frequent sweating, nausea, diarrhea, headaches, and trembling or muscle twitching.  A mental health specialist can help determine which treatment is best for you. Some people see improvement with one type of therapy. However, other people require a combination of therapies. This information is not intended to replace advice given to you by your health care provider. Make sure you discuss any questions you have with your health care provider. Document Revised: 04/14/2017 Document Reviewed: 03/22/2016 Elsevier Patient Education  2020 [employer].

## 2019-08-27 NOTE — BH Specialist Note (Signed)
Integrated Behavioral Health Initial Visit  MRN: [id number] Name: Kimberly Cummings  Number of Integrated Behavioral Health Clinician visits:: 1/6 Session Start time: 3:00 PM  Session End time: 3:15 PM Total time: 15  Type of Service: Integrated Behavioral Health- Individual Interpretor:No. Interpretor Name and Language: NA   Warm Hand Off Completed.       SUBJECTIVE: Kimberly Cummings is a 24 y.o. female accompanied by self Patient was referred by NP Zonia Kief for depression and anxiety. Patient reports the following symptoms/concerns: Pt reports increase in depression and anxiety symptoms Duration of problem: Ongoing; Severity of problem: moderate  OBJECTIVE: Mood: Anxious and Affect: Appropriate Risk of harm to self or others: No plan to harm self or others  LIFE CONTEXT: Family and Social: Pt receives strong support from partner School/Work: Pt is employed and is in school Self-Care: Pt is interested in re-initiating therapy Life Changes: Pt has difficulty managing mental health symptoms  GOALS ADDRESSED: Patient will: 1. Reduce symptoms of: anxiety and depression 2. Increase knowledge and/or ability of: coping skills  3. Demonstrate ability to: Increase healthy adjustment to current life circumstances and Increase adequate support systems for patient/family  INTERVENTIONS: Interventions utilized: Solution-Focused Strategies, Psychoeducation and/or Health Education and Link to [facility]  Standardized Assessments completed: GAD-7 and PHQ 2&9  ASSESSMENT: Patient currently experiencing increase in depression and anxiety symptoms triggered by stress and hx of trauma.   Patient may benefit from therapy. Healthy coping skills were discussed and supportive resources provided.   PLAN: 1. Follow up with behavioral health clinician on : Contact LCSW with additional behavioral health and/or resource needs 2. Behavioral recommendations: Utilize resources provided  and strategies discussed 3. Referral(s): Community Mental Health Services (LME/Outside Clinic) 4. "From scale of 1-10, how likely are you to follow plan?":   Bridgett Larsson, LCSW 08/27/19 3:28 PM

## 2019-09-02 ENCOUNTER — Ambulatory Visit: Payer: Managed Care, Other (non HMO) | Admitting: Internal Medicine

## 2019-09-04 ENCOUNTER — Ambulatory Visit (INDEPENDENT_AMBULATORY_CARE_PROVIDER_SITE_OTHER): Payer: Managed Care, Other (non HMO) | Admitting: Internal Medicine

## 2019-09-04 ENCOUNTER — Encounter: Payer: Self-pay | Admitting: Internal Medicine

## 2019-09-04 DIAGNOSIS — F4310 Post-traumatic stress disorder, unspecified: Secondary | ICD-10-CM | POA: Diagnosis not present

## 2019-09-04 DIAGNOSIS — F419 Anxiety disorder, unspecified: Secondary | ICD-10-CM | POA: Diagnosis not present

## 2019-09-04 NOTE — Progress Notes (Signed)
Virtual Visit via Telephone Note  I connected with Kimberly Cummings, on 09/04/2019 at 10:13 AM by telephone due to the COVID-19 pandemic and verified that I am speaking with the correct person using two identifiers.   Consent: I discussed the limitations, risks, security and privacy concerns of performing an evaluation and management service by telephone and the availability of in person appointments. I also discussed with the patient that there may be a patient responsible charge related to this service. The patient expressed understanding and agreed to proceed.   Location of Patient: Home   Location of Provider: Clinic    Persons participating in Telemedicine visit: [facility] Dr. Earlene Plater      History of Present Illness: Patient has a visit to f/u on anxiety. Patient was seen by [facility] 3/16. She is not seeing anyone in the community for counseling. She reports that she is feeling alright and things seem to be getting better. Has never taken medications for anxiety. Denies panic attacks.   GAD 7 : Generalized Anxiety Score 09/04/2019 07/30/2019 06/13/2019 08/10/2018  Nervous, Anxious, on Edge 1 1 1 1   Control/stop worrying 1 2 0 0  Worry too much - different things 1 1 1  0  Trouble relaxing 1 0 1 0  Restless 0 0 0 2  Easily annoyed or irritable 1 3 0 2  Afraid - awful might happen 0 1 1 0  Total GAD 7 Score 5 8 4 5       Past Medical History:  Diagnosis Date  . Overweight   . Psoriasis   . PTSD (post-traumatic stress disorder)    Allergies  Allergen Reactions  . Citrus Swelling    Tongue swelling with citrus fruits    Current Outpatient Medications on File Prior to Visit  Medication Sig Dispense Refill  . clobetasol cream (TEMOVATE) 0.05 % Apply 1 application topically daily as needed (psoriasis). 60 g 6  . Guselkumab (TREMFYA) 100 MG/ML SOPN Inject 1 mL into the skin every 8 (eight) weeks.     No current facility-administered medications  on file prior to visit.    Observations/Objective: NAD. Speaking clearly.  Work of breathing normal.  Alert and oriented. Mood appropriate.   Assessment and Plan: 1. Anxiety 2. PTSD (post-traumatic stress disorder) GAD-7 score has improved and patient notes improvements in her symptoms. She missed the call to establish with psych. I have provided her with the phone number to reach out given that referral was placed by previous provider. Follow up prn anxiety and PTSD.    Follow Up Instructions: PRN and for routine medical care    I discussed the assessment and treatment plan with the patient. The patient was provided an opportunity to ask questions and all were answered. The patient agreed with the plan and demonstrated an understanding of the instructions.   The patient was advised to call back or seek an in-person evaluation if the symptoms worsen or if the condition fails to improve as anticipated.     I provided 12 minutes total of non-face-to-face time during this encounter including median intraservice time, reviewing previous notes, investigations, ordering medications, medical decision making, coordinating care and patient verbalized understanding at the end of the visit.    , D.O. Primary Care at [facility]  09/04/2019, 10:13 AM

## 2020-02-05 ENCOUNTER — Ambulatory Visit: Payer: Managed Care, Other (non HMO)

## 2020-02-20 ENCOUNTER — Encounter: Payer: Self-pay | Admitting: Internal Medicine

## 2020-02-20 ENCOUNTER — Telehealth (INDEPENDENT_AMBULATORY_CARE_PROVIDER_SITE_OTHER): Payer: Managed Care, Other (non HMO) | Admitting: Internal Medicine

## 2020-02-20 DIAGNOSIS — M545 Low back pain, unspecified: Secondary | ICD-10-CM | POA: Diagnosis not present

## 2020-02-20 NOTE — Progress Notes (Signed)
Virtual Visit via Telephone Note  I connected with Kimberly Cummings, on 02/20/2020 at 3:46 PM by telephone due to the COVID-19 pandemic and verified that I am speaking with the correct person using two identifiers.   Consent: I discussed the limitations, risks, security and privacy concerns of performing an evaluation and management service by telephone and the availability of in person appointments. I also discussed with the patient that there may be a patient responsible charge related to this service. The patient expressed understanding and agreed to proceed.   Location of Patient: Home   Location of Provider: Clinic    Persons participating in Telemedicine visit: Juliette Standre [facility] Dr. Earlene Plater    History of Present Illness: Patient has a visit for lower back pain for x1 month. Constant, throbbing pain that occasionally turns into sharp, pinching pain. Occurs the area around her tailbone. Reports all positions---sitting, standing, laying down---all hurt and none more than the others. Walking hurts just as much. No known injury and denies recent falls. 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 Sometimes has sciatic pain but this feels different. This pain does not radiate. No saddle anesthesia. No changes in bowel or bladder function.    Past Medical History:  Diagnosis Date   Overweight    Psoriasis    PTSD (post-traumatic stress disorder)    Allergies  Allergen Reactions   Citrus Swelling    Tongue swelling with citrus fruits    Current Outpatient Medications on File Prior to Visit  Medication Sig Dispense Refill   Guselkumab (TREMFYA) 100 MG/ML SOPN Inject 1 mL into the skin every 8 (eight) weeks.     No current facility-administered medications on file prior to visit.    Observations/Objective: NAD. Speaking clearly.  Work of breathing normal.  Alert and oriented. Mood appropriate.    Assessment and Plan: 1. Acute low back pain without sciatica, unspecified back pain laterality Pain sounds more consistent with the coccyx region. No red flag symptoms. Will obtain imaging of lumbar spine and coccyx to evaluate for bony abnormality. Recommend supportive care with NSAIDs, ice/heat, rest.  - DG Sacrum/Coccyx; Future - DG Lumbar Spine Complete; Future   Follow Up Instructions: Xrays    I discussed the assessment and treatment plan with the patient. The patient was provided an opportunity to ask questions and all were answered. The patient agreed with the plan and demonstrated an understanding of the instructions.   The patient was advised to call back or seek an in-person evaluation if the symptoms worsen or if the condition fails to improve as anticipated.     I provided 14 minutes total of non-face-to-face time during this encounter including median intraservice time, reviewing previous notes, investigations, ordering medications, medical decision making, coordinating care and patient verbalized understanding at the end of the visit.    Marcy Siren, D.O. Primary Care at [facility]  02/20/2020, 3:46 PM

## 2020-02-25 ENCOUNTER — Ambulatory Visit (INDEPENDENT_AMBULATORY_CARE_PROVIDER_SITE_OTHER): Payer: Managed Care, Other (non HMO)

## 2020-02-25 ENCOUNTER — Other Ambulatory Visit: Payer: Self-pay

## 2020-02-25 DIAGNOSIS — M545 Low back pain, unspecified: Secondary | ICD-10-CM

## 2020-02-25 NOTE — Progress Notes (Signed)
Patient here for xrays ordered during recent telehealth appointment.

## 2021-01-21 ENCOUNTER — Encounter: Payer: Self-pay | Admitting: Internal Medicine

## 2021-06-17 DIAGNOSIS — Z79899 Other long term (current) drug therapy: Secondary | ICD-10-CM | POA: Diagnosis not present

## 2021-06-17 DIAGNOSIS — L400 Psoriasis vulgaris: Secondary | ICD-10-CM | POA: Diagnosis not present

## 2021-06-17 DIAGNOSIS — L659 Nonscarring hair loss, unspecified: Secondary | ICD-10-CM | POA: Diagnosis not present

## 2021-06-21 DIAGNOSIS — Z111 Encounter for screening for respiratory tuberculosis: Secondary | ICD-10-CM | POA: Diagnosis not present

## 2021-06-21 DIAGNOSIS — L409 Psoriasis, unspecified: Secondary | ICD-10-CM | POA: Diagnosis not present

## 2022-01-14 DIAGNOSIS — L409 Psoriasis, unspecified: Secondary | ICD-10-CM | POA: Diagnosis not present

## 2022-01-14 DIAGNOSIS — D259 Leiomyoma of uterus, unspecified: Secondary | ICD-10-CM | POA: Diagnosis not present

## 2022-01-14 DIAGNOSIS — M25551 Pain in right hip: Secondary | ICD-10-CM | POA: Diagnosis not present

## 2022-01-14 DIAGNOSIS — L658 Other specified nonscarring hair loss: Secondary | ICD-10-CM | POA: Diagnosis not present

## 2022-07-06 IMAGING — DX DG LUMBAR SPINE COMPLETE 4+V
5 series · 5 of 5 positions shown · non-contrast
Comparison: Plain films lumbar spine 12/18/2018.

CLINICAL DATA: Low back and coccygeal pain.

EXAM:
LUMBAR SPINE - COMPLETE 4+ VIEW

[lumbar spine ap]
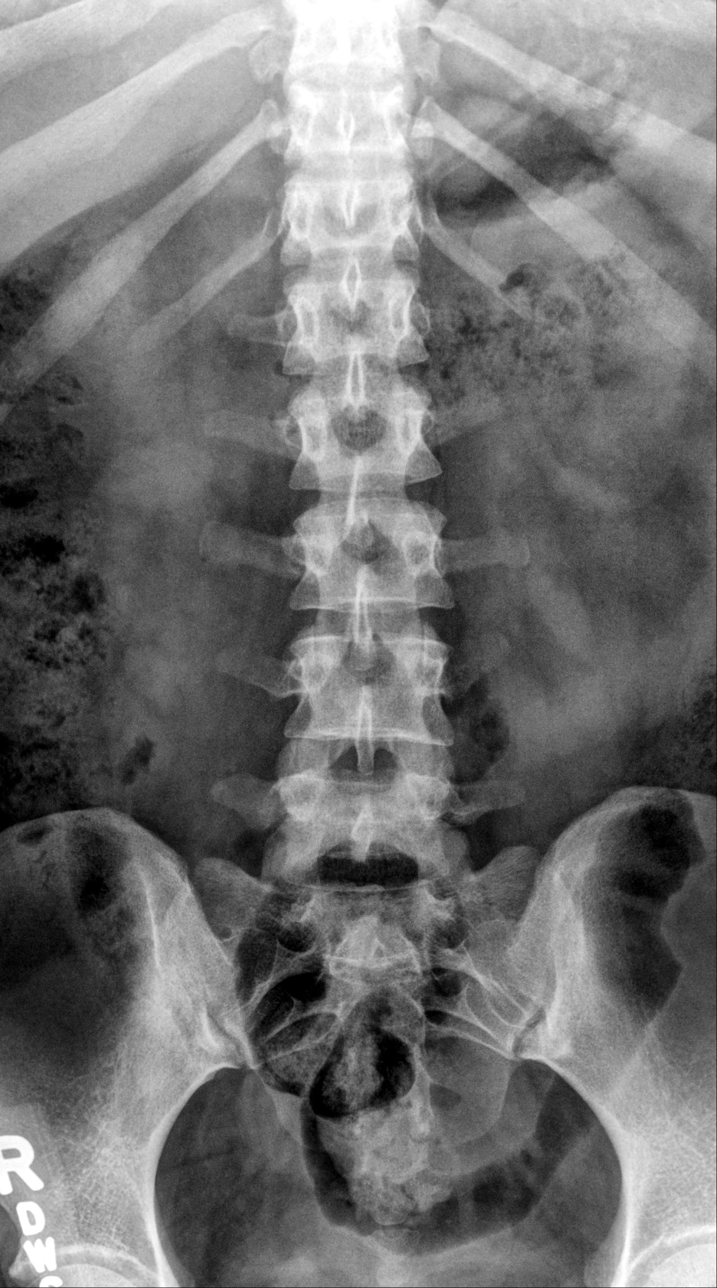

[lumbar spine oblique supine (1 of 2)]
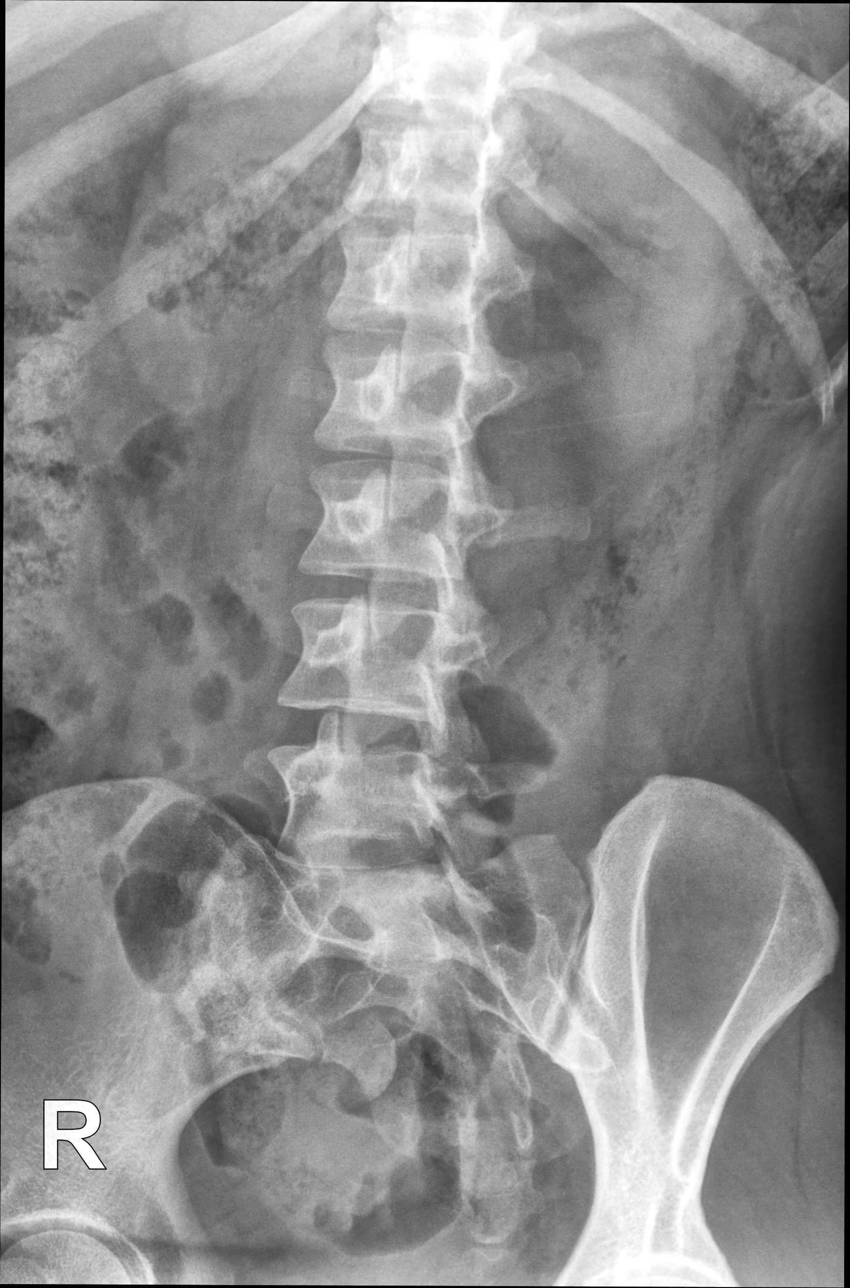

[lumbar spine oblique supine (2 of 2)]
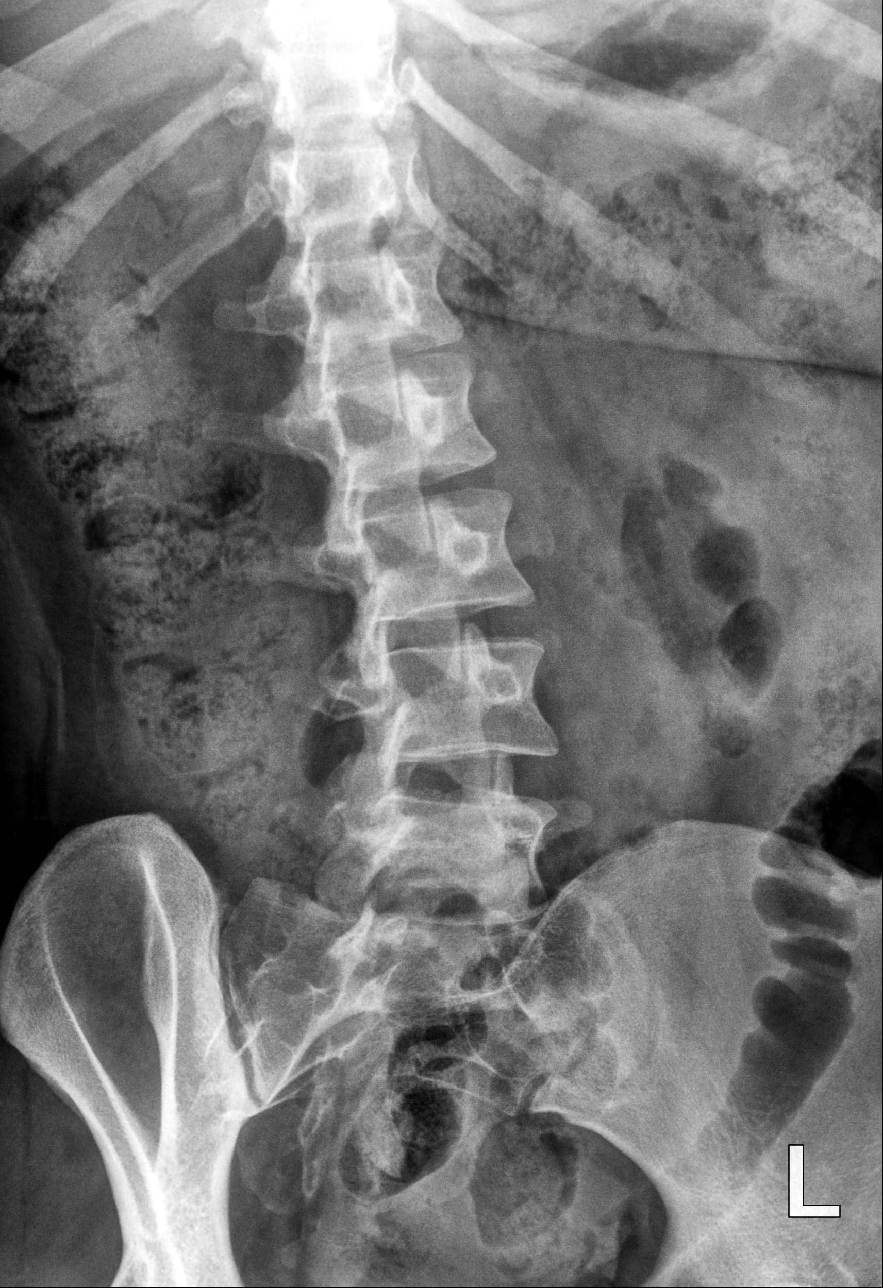

[lumbar spine lat (1 of 2)]
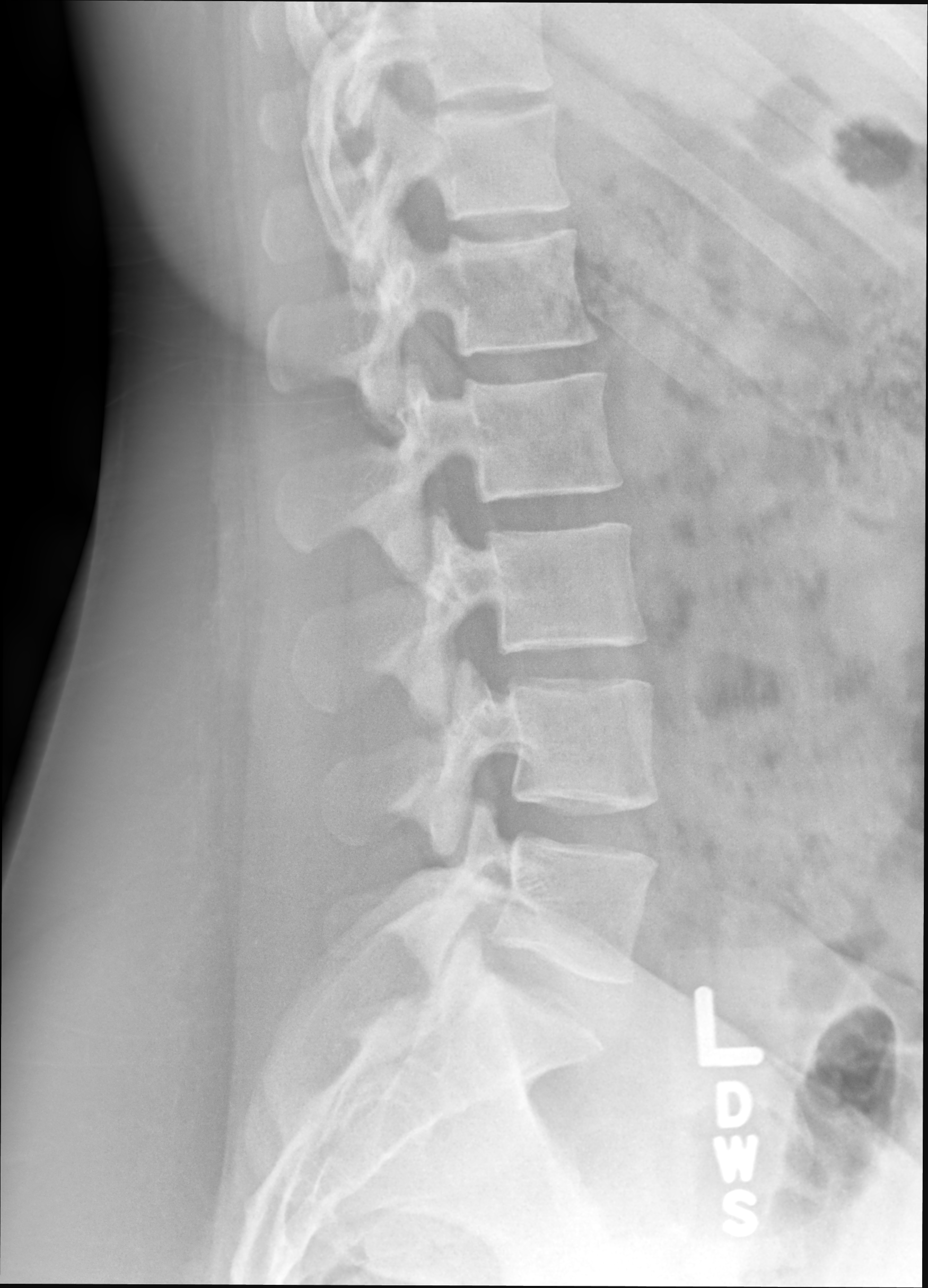

[lumbar spine lat (2 of 2)]
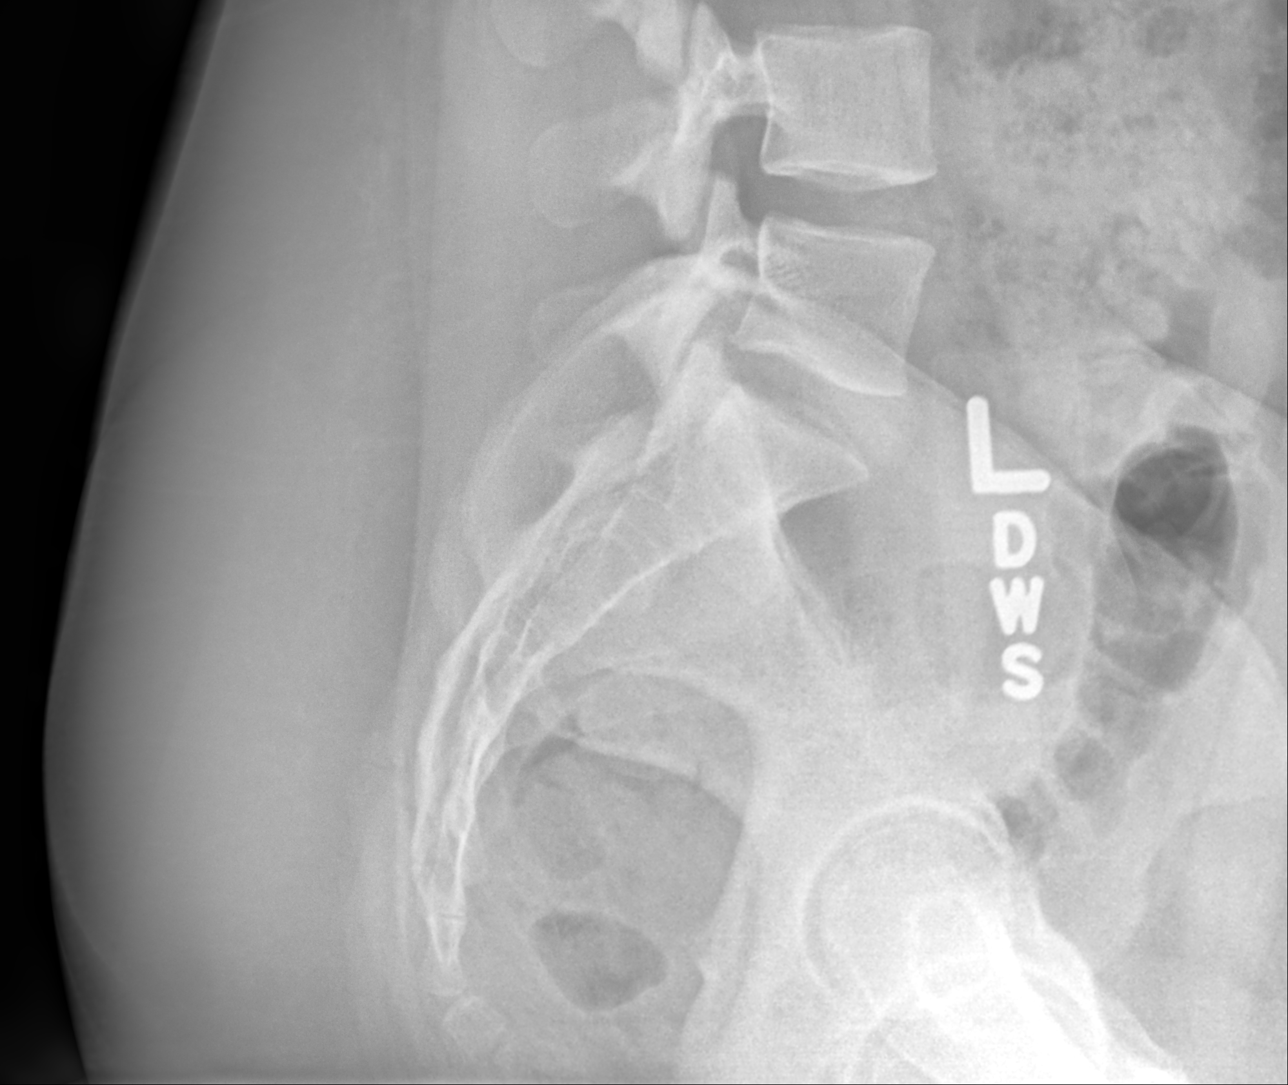

[5 of 5 positions shown; findings below may reference images not displayed]

FINDINGS: There is no evidence of lumbar spine fracture. Alignment is normal.
Intervertebral disc spaces are maintained.
IMPRESSION: Normal exam.

## 2022-08-22 DIAGNOSIS — Z79899 Other long term (current) drug therapy: Secondary | ICD-10-CM | POA: Diagnosis not present

## 2022-08-22 DIAGNOSIS — L400 Psoriasis vulgaris: Secondary | ICD-10-CM | POA: Diagnosis not present

## 2022-08-25 ENCOUNTER — Other Ambulatory Visit: Payer: Self-pay | Admitting: Obstetrics and Gynecology

## 2022-08-25 ENCOUNTER — Other Ambulatory Visit (HOSPITAL_COMMUNITY)
Admission: RE | Admit: 2022-08-25 | Discharge: 2022-08-25 | Disposition: A | Discharge status: Home / Self Care | Payer: BC Managed Care – PPO | Source: Ambulatory Visit | Attending: Obstetrics and Gynecology | Admitting: Obstetrics and Gynecology

## 2022-08-25 DIAGNOSIS — Z01419 Encounter for gynecological examination (general) (routine) without abnormal findings: Secondary | ICD-10-CM | POA: Insufficient documentation

## 2022-08-25 DIAGNOSIS — N930 Postcoital and contact bleeding: Secondary | ICD-10-CM | POA: Diagnosis not present

## 2022-08-25 DIAGNOSIS — N921 Excessive and frequent menstruation with irregular cycle: Secondary | ICD-10-CM | POA: Diagnosis not present

## 2022-08-25 DIAGNOSIS — N946 Dysmenorrhea, unspecified: Secondary | ICD-10-CM | POA: Diagnosis not present

## 2022-08-30 LAB — CYTOLOGY - PAP
Chlamydia: NEGATIVE
Comment: NEGATIVE
Comment: NORMAL
Diagnosis: NEGATIVE
Neisseria Gonorrhea: NEGATIVE

## 2022-09-14 DIAGNOSIS — N921 Excessive and frequent menstruation with irregular cycle: Secondary | ICD-10-CM | POA: Diagnosis not present

## 2022-09-14 DIAGNOSIS — N946 Dysmenorrhea, unspecified: Secondary | ICD-10-CM | POA: Diagnosis not present

## 2022-11-08 DIAGNOSIS — L400 Psoriasis vulgaris: Secondary | ICD-10-CM | POA: Diagnosis not present

## 2022-11-08 DIAGNOSIS — Z79899 Other long term (current) drug therapy: Secondary | ICD-10-CM | POA: Diagnosis not present

## 2023-02-13 DIAGNOSIS — B3731 Acute candidiasis of vulva and vagina: Secondary | ICD-10-CM | POA: Diagnosis not present

## 2023-06-26 DIAGNOSIS — L400 Psoriasis vulgaris: Secondary | ICD-10-CM | POA: Diagnosis not present

## 2023-06-26 DIAGNOSIS — Z79899 Other long term (current) drug therapy: Secondary | ICD-10-CM | POA: Diagnosis not present

## 2023-07-17 DIAGNOSIS — Z1159 Encounter for screening for other viral diseases: Secondary | ICD-10-CM | POA: Diagnosis not present

## 2023-07-17 DIAGNOSIS — E6609 Other obesity due to excess calories: Secondary | ICD-10-CM | POA: Diagnosis not present

## 2023-07-20 DIAGNOSIS — Z0000 Encounter for general adult medical examination without abnormal findings: Secondary | ICD-10-CM | POA: Diagnosis not present

## 2023-07-30 DIAGNOSIS — L509 Urticaria, unspecified: Secondary | ICD-10-CM | POA: Diagnosis not present

## 2023-09-04 DIAGNOSIS — Z111 Encounter for screening for respiratory tuberculosis: Secondary | ICD-10-CM | POA: Diagnosis not present

## 2023-10-03 DIAGNOSIS — N946 Dysmenorrhea, unspecified: Secondary | ICD-10-CM | POA: Diagnosis not present

## 2023-10-03 DIAGNOSIS — R7989 Other specified abnormal findings of blood chemistry: Secondary | ICD-10-CM | POA: Diagnosis not present

## 2023-10-03 DIAGNOSIS — Z01419 Encounter for gynecological examination (general) (routine) without abnormal findings: Secondary | ICD-10-CM | POA: Diagnosis not present

## 2023-10-03 DIAGNOSIS — E282 Polycystic ovarian syndrome: Secondary | ICD-10-CM | POA: Diagnosis not present

## 2023-10-03 DIAGNOSIS — Z23 Encounter for immunization: Secondary | ICD-10-CM | POA: Diagnosis not present

## 2023-10-03 DIAGNOSIS — N926 Irregular menstruation, unspecified: Secondary | ICD-10-CM | POA: Diagnosis not present

## 2023-10-05 DIAGNOSIS — R7989 Other specified abnormal findings of blood chemistry: Secondary | ICD-10-CM | POA: Diagnosis not present

## 2023-10-20 DIAGNOSIS — R1110 Vomiting, unspecified: Secondary | ICD-10-CM | POA: Diagnosis not present

## 2023-10-30 DIAGNOSIS — Z3480 Encounter for supervision of other normal pregnancy, unspecified trimester: Secondary | ICD-10-CM | POA: Diagnosis not present

## 2023-10-30 LAB — OB RESULTS CONSOLE RUBELLA ANTIBODY, IGM: Rubella: IMMUNE

## 2023-10-30 LAB — OB RESULTS CONSOLE HIV ANTIBODY (ROUTINE TESTING): HIV: NONREACTIVE

## 2023-10-30 LAB — HEPATITIS C ANTIBODY: HCV Ab: NEGATIVE

## 2023-10-30 LAB — OB RESULTS CONSOLE RPR: RPR: NONREACTIVE

## 2023-10-30 LAB — OB RESULTS CONSOLE HEPATITIS B SURFACE ANTIGEN: Hepatitis B Surface Ag: NEGATIVE

## 2023-10-30 LAB — OB RESULTS CONSOLE ANTIBODY SCREEN: Antibody Screen: NEGATIVE

## 2023-10-30 LAB — OB RESULTS CONSOLE ABO/RH: RH Type: POSITIVE

## 2023-10-31 DIAGNOSIS — O09291 Supervision of pregnancy with other poor reproductive or obstetric history, first trimester: Secondary | ICD-10-CM | POA: Diagnosis not present

## 2023-10-31 DIAGNOSIS — O26851 Spotting complicating pregnancy, first trimester: Secondary | ICD-10-CM | POA: Diagnosis not present

## 2023-10-31 DIAGNOSIS — O26899 Other specified pregnancy related conditions, unspecified trimester: Secondary | ICD-10-CM | POA: Diagnosis not present

## 2023-11-20 ENCOUNTER — Encounter (HOSPITAL_BASED_OUTPATIENT_CLINIC_OR_DEPARTMENT_OTHER): Payer: Self-pay

## 2023-11-20 ENCOUNTER — Other Ambulatory Visit: Payer: Self-pay

## 2023-11-20 ENCOUNTER — Emergency Department (HOSPITAL_BASED_OUTPATIENT_CLINIC_OR_DEPARTMENT_OTHER)
Admission: EM | Admit: 2023-11-20 | Discharge: 2023-11-20 | Disposition: A | Discharge status: Home / Self Care | Source: Ambulatory Visit | Attending: Emergency Medicine | Admitting: Emergency Medicine

## 2023-11-20 DIAGNOSIS — O26891 Other specified pregnancy related conditions, first trimester: Secondary | ICD-10-CM | POA: Insufficient documentation

## 2023-11-20 DIAGNOSIS — O219 Vomiting of pregnancy, unspecified: Secondary | ICD-10-CM | POA: Diagnosis not present

## 2023-11-20 DIAGNOSIS — H53149 Visual discomfort, unspecified: Secondary | ICD-10-CM | POA: Insufficient documentation

## 2023-11-20 DIAGNOSIS — R55 Syncope and collapse: Secondary | ICD-10-CM | POA: Diagnosis not present

## 2023-11-20 DIAGNOSIS — R519 Headache, unspecified: Secondary | ICD-10-CM | POA: Insufficient documentation

## 2023-11-20 DIAGNOSIS — R112 Nausea with vomiting, unspecified: Secondary | ICD-10-CM

## 2023-11-20 DIAGNOSIS — Z3A10 10 weeks gestation of pregnancy: Secondary | ICD-10-CM | POA: Diagnosis not present

## 2023-11-20 DIAGNOSIS — Z3A01 Less than 8 weeks gestation of pregnancy: Secondary | ICD-10-CM | POA: Diagnosis not present

## 2023-11-20 LAB — BASIC METABOLIC PANEL WITH GFR
Anion gap: 12 (ref 5–15)
BUN: 7 mg/dL (ref 6–20)
CO2: 22 mmol/L (ref 22–32)
Calcium: 9.6 mg/dL (ref 8.9–10.3)
Chloride: 103 mmol/L (ref 98–111)
Creatinine, Ser: 0.6 mg/dL (ref 0.44–1.00)
GFR, Estimated: >60 mL/min (ref >60)
Glucose, Bld: 78 mg/dL (ref 70–99)
Potassium: 4 mmol/L (ref 3.5–5.1)
Sodium: 137 mmol/L (ref 135–145)

## 2023-11-20 LAB — CBC
HCT: 35.8 % — ABNORMAL LOW (ref 36.0–46.0)
Hemoglobin: 12.3 g/dL (ref 12.0–15.0)
MCH: 30.2 pg (ref 26.0–34.0)
MCHC: 34.4 g/dL (ref 30.0–36.0)
MCV: 88 fL (ref 80.0–100.0)
Platelets: 227 K/uL (ref 150–400)
RBC: 4.07 MIL/uL (ref 3.87–5.11)
RDW: 12.3 % (ref 11.5–15.5)
WBC: 7.9 K/uL (ref 4.0–10.5)
nRBC: 0 % (ref 0.0–0.2)

## 2023-11-20 MED ORDER — SODIUM CHLORIDE 0.9 % IV BOLUS
1000.0000 mL | Freq: Once | INTRAVENOUS | Status: AC
  Administered 2023-11-20: 1000 mL via INTRAVENOUS

## 2023-11-20 MED ORDER — ACETAMINOPHEN 500 MG PO TABS
1000.0000 mg | ORAL_TABLET | Freq: Once | ORAL | Status: AC
  Administered 2023-11-20: 1000 mg via ORAL
  Filled 2023-11-20: qty 2

## 2023-11-20 NOTE — ED Triage Notes (Signed)
 Pt c/o migraine w dizziness/ nausea, sensitivity to light/ sound. Tylenol  w no relief- last dose last night. Denies hx migraines Tried going to UC but they told me to come here.  G2P0, approx 10wks preg

## 2023-11-20 NOTE — Discharge Instructions (Addendum)
 Evaluation today was overall reassuring.  Suspect this is a migraine complicated by ongoing pregnancy.  Please continue assertive hydration with water and Gatorade or any other electrolyte drink of your choice.  Please follow-up with your OB/GYN provider.  They may have strategies for nausea or vomiting at home that could be useful especially in the first trimester.  If your symptoms worsen in any way please return to the ED for further evaluation.

## 2023-11-20 NOTE — ED Provider Notes (Signed)
 [place] EMERGENCY DEPARTMENT AT [facility] Provider Note   CSN: [id number] Arrival date & time: 11/20/23  1207     Patient presents with: Migraine  HPI Kimberly Cummings is a 28 y.o. female presenting for headache.  Also reports that she is [redacted] weeks pregnant.  She states the headache started this past Saturday.  It started gradually as been constant since then.  The pain is concentrated about the forehead and behind the eyes.  She does endorse photophobia.  Denies visual disturbance.  Denies fever or nuchal rigidity.  She reports that she has been vomiting about once or twice every other day since she found out she was pregnant a few weeks ago.  Denies abdominal pain.  Denies diarrhea.  Endorses intermittent nausea.  Took Tylenol  for headache last night.  She does report a history of migraines and states that this headache does feel similar.    Migraine       Prior to Admission medications   Medication Sig Start Date End Date Taking? Authorizing Provider  norgestimate-ethinyl estradiol (ORTHO-CYCLEN) 0.25-35 MG-MCG tablet Take 1 tablet by mouth daily. 10/03/23  Yes [provider]  Guselkumab (TREMFYA) 100 MG/ML SOPN Inject 1 mL into the skin every 8 (eight) weeks.    [provider]    Allergies: Citrus    Review of Systems See HPI  Updated Vital Signs BP 111/67   Pulse 63   Temp 98.5 F (36.9 C)   Resp 16   LMP 09/12/2023 (Approximate)   SpO2 100%   Physical Exam Vitals and nursing note reviewed.  HENT:     Head: Normocephalic and atraumatic.     Mouth/Throat:     Mouth: Mucous membranes are moist.  Eyes:     General:        Right eye: No discharge.        Left eye: No discharge.     Conjunctiva/sclera: Conjunctivae normal.  Cardiovascular:     Rate and Rhythm: Normal rate and regular rhythm.     Pulses: Normal pulses.     Heart sounds: Normal heart sounds.  Pulmonary:     Effort: Pulmonary effort is normal.     Breath sounds:  Normal breath sounds.  Abdominal:     General: Abdomen is flat.     Palpations: Abdomen is soft.     Tenderness: There is no abdominal tenderness.  Skin:    General: Skin is warm and dry.  Neurological:     General: No focal deficit present.     Comments: GCS 15. Speech is goal oriented. No deficits appreciated to CN III-XII; symmetric eyebrow raise, no facial drooping, tongue midline. Patient has equal grip strength bilaterally with 5/5 strength against resistance in all major muscle groups bilaterally. Sensation to light touch intact. Patient moves extremities without ataxia. Normal finger-nose-finger. Patient ambulatory with steady gait.  Psychiatric:        Mood and Affect: Mood normal.     (all labs ordered are listed, but only abnormal results are displayed) Labs Reviewed  CBC - Abnormal; Notable for the following components:      Result Value   HCT 35.8 (*)    All other components within normal limits  BASIC METABOLIC PANEL WITH GFR  PREGNANCY, URINE    EKG: None  Radiology: No results found.   Procedures   Medications Ordered in the ED  sodium chloride  0.9 % bolus 1,000 mL (1,000 mLs Intravenous New Bag/Given 11/20/23 1522)  acetaminophen  (TYLENOL )  tablet 1,000 mg (1,000 mg Oral Given 11/20/23 1523)                                    Medical Decision Making Amount and/or Complexity of Data Reviewed Labs: ordered.  Risk OTC drugs.   Initial Impression and Ddx 28 year old well-appearing female present for headache.  Exam was unremarkable.  DDx includes eclampsia, cerebral venous thrombosis, migraine, hyperemesis gravidarum, electrolyte derangement, intra-abdominal infection, other. Patient PMH that increases complexity of ED encounter:  pregnant  Interpretation of Diagnostics - I independent reviewed and interpreted the labs as followed: none  Patient Reassessment and Ultimate Disposition/Management On reassessment, she stated headache had improved  considerably.  No witnessed vomiting during this encounter.  Suspect this is a migraine complicated by pregnancy.  Fluid challenge with no issue.  Advised to continue assertive hydration at home and continue take Tylenol  as needed.  Advised her to follow-up with her OB/GYN provider.  She did states she has an appointment next Monday.  Discussed return precautions.  Discharged good condition.  Patient management required discussion with the following services or consulting groups:  None  Complexity of Problems Addressed Acute complicated illness or Injury  Additional Data Reviewed and Analyzed Further history obtained from: Past medical history and medications listed in the EMR and Prior ED visit notes  Patient Encounter Risk Assessment Consideration of hospitalization      Final diagnoses:  Nonintractable headache, unspecified chronicity pattern, unspecified headache type  Nausea and vomiting, unspecified vomiting type    ED Discharge Orders     None          Lang Norleen POUR, PA-C 11/20/23 1643    Dreama Longs, MD 11/20/23 2329

## 2023-11-27 DIAGNOSIS — O468X1 Other antepartum hemorrhage, first trimester: Secondary | ICD-10-CM | POA: Diagnosis not present

## 2023-12-01 DIAGNOSIS — Z3A24 24 weeks gestation of pregnancy: Secondary | ICD-10-CM | POA: Diagnosis not present

## 2023-12-01 DIAGNOSIS — Z3481 Encounter for supervision of other normal pregnancy, first trimester: Secondary | ICD-10-CM | POA: Diagnosis not present

## 2023-12-01 DIAGNOSIS — Z3490 Encounter for supervision of normal pregnancy, unspecified, unspecified trimester: Secondary | ICD-10-CM | POA: Diagnosis not present

## 2023-12-01 DIAGNOSIS — Z3482 Encounter for supervision of other normal pregnancy, second trimester: Secondary | ICD-10-CM | POA: Diagnosis not present

## 2023-12-01 LAB — OB RESULTS CONSOLE GC/CHLAMYDIA
Chlamydia: NEGATIVE
Neisseria Gonorrhea: NEGATIVE

## 2023-12-18 DIAGNOSIS — Z3402 Encounter for supervision of normal first pregnancy, second trimester: Secondary | ICD-10-CM | POA: Diagnosis not present

## 2023-12-18 DIAGNOSIS — Z3490 Encounter for supervision of normal pregnancy, unspecified, unspecified trimester: Secondary | ICD-10-CM | POA: Diagnosis not present

## 2024-01-24 DIAGNOSIS — O99212 Obesity complicating pregnancy, second trimester: Secondary | ICD-10-CM | POA: Diagnosis not present

## 2024-01-24 DIAGNOSIS — Z36 Encounter for antenatal screening for chromosomal anomalies: Secondary | ICD-10-CM | POA: Diagnosis not present

## 2024-01-24 DIAGNOSIS — Z23 Encounter for immunization: Secondary | ICD-10-CM | POA: Diagnosis not present

## 2024-03-07 ENCOUNTER — Encounter: Payer: Self-pay | Admitting: Family Medicine

## 2024-03-07 ENCOUNTER — Ambulatory Visit (INDEPENDENT_AMBULATORY_CARE_PROVIDER_SITE_OTHER): Admitting: Family Medicine

## 2024-03-07 ENCOUNTER — Other Ambulatory Visit: Payer: Self-pay

## 2024-03-07 VITALS — BP 104/69 | HR 100 | Wt 228.9 lb

## 2024-03-07 DIAGNOSIS — Z3A26 26 weeks gestation of pregnancy: Secondary | ICD-10-CM | POA: Diagnosis not present

## 2024-03-07 DIAGNOSIS — L409 Psoriasis, unspecified: Secondary | ICD-10-CM

## 2024-03-07 DIAGNOSIS — Z3402 Encounter for supervision of normal first pregnancy, second trimester: Secondary | ICD-10-CM

## 2024-03-07 NOTE — Progress Notes (Signed)
 Subjective:   Kimberly Cummings is a 28 y.o. G1P0 at [redacted]w[redacted]d by early ultrasound being seen today for her first obstetrical visit.  Her obstetrical history is significant for none. Patient does intend to breast feed. Pregnancy history fully reviewed.  Patient reports no complaints.  HISTORY: OB History  Gravida Para Term Preterm AB Living  1 0 0 0 0 0  SAB IAB Ectopic Multiple Live Births  0 0 0 0 0    # Outcome Date GA Lbr Len/2nd Weight Sex Type Anes PTL Lv  1 Current              Last pap smear: Result Date Procedure Results Follow-ups  08/25/2022 Cytology - PAP Neisseria Gonorrhea: Negative Chlamydia: Negative Adequacy: Satisfactory for evaluation; transformation zone component PRESENT. Diagnosis: - Negative for intraepithelial lesion or malignancy (NILM) Comment: Normal Reference [place] Chlamydia - Negative Comment: Normal Reference Range Neisseria Gonorrhea - Negative   12/10/2018 Cytology - PAP([place]) Adequacy: Satisfactory for evaluation  endocervical/transformation zone component PRESENT. Diagnosis: NEGATIVE FOR INTRAEPITHELIAL LESIONS OR MALIGNANCY. Material Submitted: CervicoVaginal Pap [ThinPrep Imaged] CYTOLOGY - PAP: PAP RESULT     Past Medical History:  Diagnosis Date   Overweight    Psoriasis    PTSD (post-traumatic stress disorder)    Past Surgical History:  Procedure Laterality Date   NO PAST SURGERIES     Family History  Problem Relation Age of Onset   Hyperlipidemia Mother    Hypertension Father    Stroke Neg Hx    Heart attack Neg Hx    Social History   Tobacco Use   Smoking status: Never   Smokeless tobacco: Never  Vaping Use   Vaping status: Never Used  Substance Use Topics   Alcohol use: Not Currently    Comment: occas.    Drug use: Never   Allergies  Allergen Reactions   Citrus Swelling    Tongue swelling with citrus fruits   Current Outpatient Medications on File Prior to Visit  Medication Sig Dispense Refill    Guselkumab (TREMFYA) 100 MG/ML SOPN Inject 1 mL into the skin every 8 (eight) weeks. (Patient not taking: Reported on 03/07/2024)     norgestimate-ethinyl estradiol  (ORTHO-CYCLEN) 0.25-35 MG-MCG tablet Take 1 tablet by mouth daily. (Patient not taking: Reported on 03/07/2024)     Prenatal Vit-Fe Fumarate-FA (PRENATAL VITAMINS) 28-0.8 MG TABS 1 tablet Orally Once a day     No current facility-administered medications on file prior to visit.     Exam   Vitals:   03/07/24 1423  BP: 104/69  Pulse: 100  Weight: 228 lb 14.4 oz (103.8 kg)   Fetal Heart Rate (bpm): 154  System: General: well-developed, well-nourished female in no acute distress   Skin: normal coloration and turgor, no rashes   Neurologic: oriented, normal, negative, normal mood   Extremities: normal strength, tone, and muscle mass, ROM of all joints is normal   HEENT PERRLA, extraocular movement intact and sclera clear, anicteric   Neck supple and no masses   Respiratory:  no respiratory distress      Assessment:   Pregnancy: G1P0 Patient Active Problem List   Diagnosis Date Noted   Encounter for supervision of normal first pregnancy in second trimester 03/07/2024   Dyslipidemia 10/11/2017   Snoring 10/11/2017   Obesity (BMI 30-39.9) 10/11/2017   Posttraumatic stress disorder 09/19/2017   Atypical chest pain 09/19/2017     Plan:  1. Encounter for supervision of normal first pregnancy in  second trimester (Primary) Transfer from [facility], records reviewed and abstracted Up to date on all labs, will need third trimester labs at next visit Continue prenatal vitamins. Has had anatomy scan but only mentioned in a note, actual result not available and note mentions suboptimal views due to habitus, will order for MFM growth scan Problem list reviewed and updated. The nature of [facility] with multiple MDs and other Advanced Practice Providers was explained to patient; also  emphasized that residents, students are part of our team.  2. Psoriasis Was previously on a biologic called Tremfya prior to getting pregnant, wondering if it is safe in breastfeeding No clear data from what I can find and told patient that the data likely does not exist It appears to be IgG monoclonal antibody so likely enters milk  Routine obstetric precautions reviewed. Return in 2 weeks (on 03/21/2024) for ob visit, 28 wk labs.    Kimberly CHRISTELLA Carolus, MD/MPH Attending Family Medicine Physician, [facility] for [facility], [facility]

## 2024-03-07 NOTE — Patient Instructions (Signed)
 Tercer trimestre de Psychiatrist Third Trimester of Pregnancy  El tercer trimestre de embarazo va desde la semana 28 hasta la semana 40. Esto corresponde a los meses 7 a 9. El tercer trimestre es un perodo en el que el beb crece rpido. Cambios en el cuerpo durante el tercer trimestre Su cuerpo contina cambiando durante este perodo. En general, los cambios desaparecen despus del nacimiento del beb. Cambios fsicos Usted seguir aumentando de [place]. Podrn aparecer Albertson's caderas, la barriga y las [place]. Las [employer] seguirn creciendo y pueden dolerle. Un lquido amarillo Charity fundraiser) puede salir de sus pechos. Esta es la primera leche que usted produce para el beb. El cabello puede crecerle ms rpido y engrosarse. En algunos casos, puede haber cada del cabello. El ombligo puede salir hacia afuera. Puede observar que se le hinchan ms las [address], la cara o los tobillos. Cambios en la salud Es posible que tenga acidez estomacal. Es posible que sienta que le falta el aire. La causa de esto es que el tero ahora es ms grande. Puede presentar ms dolor en la pelvis, la espalda o los muslos. Puede presentar ms hormigueo o entumecimiento en las manos, los brazos y las piernas. Es posible que orine con mayor frecuencia. Puede tener dificultad para defecar (estreimiento) o venas hinchadas en el ano que pueden picar o doler (hemorroides). Otros cambios Puede tener ms problemas para dormir. Puede notar que el beb se mueve ms hacia bajo adentro de la barriga [place]"). Puede ser que le salga ms lquido de la vagina. Puede sentir las articulaciones flojas y puede sentir dolor alrededor del hueso plvico. Siga estas instrucciones en su casa: Medicamentos Use los medicamentos solamente como se lo haya indicado el mdico. Algunos medicamentos no son seguros Academic librarian. El mdico puede cambiar los medicamentos que [place]. No use ningn medicamento a menos que se lo haya indicado el  mdico. Tome vitaminas prenatales que tengan por lo menos 600 microgramos (mcg) de cido flico. No consuma medicamentos a base de hierbas, drogas ilegales, ni medicamentos que el mdico no haya autorizado. Comida y bebida Durante el [place], [place] cuerpo necesita nutricin adicional para brindar sustento al beb que est creciendo. Hable con su mdico sobre sus necesidades nutricionales. Actividad La mayora de las mujeres pueden hacer ejercicio regularmente durante el [place]. Las rutinas de ejercicio pueden tener que cambiar en la etapa final del [place]. Hable con su mdico sobre sus actividades y [place] de ejercicio. Alivio del dolor y del malestar Descanse a menudo y con las piernas elevadas si tiene Educational psychologist en las piernas o dolor de Patent attorney. Tome baos de asiento tibios para Engineer, materials de las hemorroides. Use una crema para las hemorroides si el mdico se lo permite. Use un sostn que le brinde buen soporte si le [employer]. No se d baos de inmersin en agua caliente, baos turcos ni saunas. No se haga duchas vaginales. No use tampones ni protectores de ropa interior perfumados. Seguridad Hable con su mdico antes de viajar distancias largas. Use el cinturn de seguridad en todo momento mientras vaya en auto. Hable con su mdico si alguien la golpea, la lastima o le grita. Preparacin para el nacimiento Preparacin para recibir al beb: Tome clases para prepararse para el parto y Patent examiner. Visite el hospital y recorra el rea de maternidad. Compre un asiento de seguridad [employer] atrs para llevar al beb en el automvil. Aprenda cmo instalarlo en el auto. Instrucciones generales Evite el contacto con  las bandejas sanitarias de los gatos y la tierra que estos animales usan. Estos objetos tienen grmenes que puedenperjudicar el embarazo y el beb. No beba alcohol, no fume, no vapee ni consuma productos que tengan nicotina o tabaco. Si necesita ayuda para  dejar de fumar, hable con su mdico. Asista a todas las visitas de seguimiento del tercer trimestre. El mdico le har ms exmenes y [employer] trimestre. Escriba sus preguntas. Llvelas cuando concurra a las visitas prenatales. El mdico tambin: Hablar con usted sobre su salud general. Conley Rolls dar consejos o la derivar a especialistas que puedan ayudarla con diferentes necesidades, entre ellas: Salud mental y psicoterapia. Alimentos y alimentacin saludable. Si necesita ayuda con alimentos, pdala. Dnde buscar ms informacin American Pregnancy Association (Asociacin Americana del Embarazo): americanpregnancy.org [employer] of Obstetricians and Gynecologists (Colegio Estadounidense de Obstetras y Scientific laboratory technician): acog.org Office on [employer] (Oficina para la Salud de la Mujer): TravelLesson.ca Comunquese con un mdico si: Tiene un dolor de cabeza que no desaparece despus de Science writer. Tiene alguno de estos problemas: No puede comer ni beber. Tiene nuseas y vmitos. Hace deposiciones acuosas (diarrea) durante 2 o ms das. Siente dolor al orinar o hace orina con mal olor. Se ha sentido enferma durante 2 o ms das y no mejora. Comunquese con su mdico de inmediato si: Alguna de estas sustancias emana de la vagina: Secrecin anormal. Lquido con mal olor. Sangrado. El beb se mueve menos de lo habitual. Presenta signos de [place] de parto: Tiene contracciones, clicos en la barriga o dolor en la pelvis o la parte baja de la espalda antes de las 37 semanas de embarazo (trabajo de parto prematuro). Tiene contracciones regulares separadas por menos de 5 minutos. Rompe la bolsa. Tiene sntomas de presin arterial alta o preeclampsia. Esto incluye lo siguiente: Dolor de [place] intenso y punzante que no desaparece. Hinchazn sbita o extrema del rostro, las manos, las piernas o los pies. Problemas de visin: Ve manchas. Tiene la visin borrosa. Los ojos  tienen sensibilidad a Statistician. Si no puede comunicarse con su mdico, acuda a una sala de atencin de urgencias o emergencias. Solicite ayuda de inmediato si: Se desmaya, se siente confundida o no puede pensar con claridad. Tiene dolor en el pecho o dificultad para respirar. Sufre cualquier tipo de lesin, por ejemplo, debido a una cada o un accidente automovilstico. Estos sntomas pueden indicar una emergencia. Llame al 911 de inmediato. No espere a ver si los sntomas desaparecen. No conduzca por sus propios medios [employer]. Esta informacin no tiene Theme park manager el consejo del mdico. Asegrese de hacerle al mdico cualquier pregunta que tenga. Document Revised: 10/06/2022 Document Reviewed: 10/06/2022 Elsevier Patient Education  2024 Elsevier Inc. Opciones de mtodos anticonceptivos Birth Control Options Los mtodos anticonceptivos tambin se denominan anticonceptivos. Los anticonceptivos previenen Firefighter. Hay muchos tipos de anticonceptivos. Trabaje con el mdico para encontrar la opcin ms adecuada para usted. Anticonceptivos que [place] hormonas Estos tipos de anticonceptivos contienen hormonas para Neurosurgeon. Implante anticonceptivo Este es un pequeo tubo que se coloca dentro de la piel del brazo. El tubo Insurance claims handler colocado durante 3 aos como mximo. Inyeccin anticonceptiva Son inyecciones que se aplican cada 3 meses. Pldoras anticonceptivas Esta es una pldora que se toma todos Cable. Debe tomarla a la [employer]. Parche anticonceptivo Este es un parche que se coloca sobre la piel. Se debe cambiar 1 vez por semana durante 3 semanas. Despus de [employer],  el parche se debe retirar durante 1 semana. Anillo vaginal  Este es un anillo de plstico blando que se coloca dentro de la vagina. El anillo se deja colocado durante 3 semanas. Luego, se debe retirar durante 1 semana. Despus se coloca un nuevo anillo. Mtodos de  barrera  Preservativo masculino Es una cubierta delgada que se coloca sobre el pene antes de [place]. El preservativo se desecha despus de Doctor, hospital. Preservativo femenino Es una cubierta blanda y suelta que se coloca en la vagina antes de [place]. El preservativo se desecha despus de Doctor, hospital. Diafragma El diafragma es una barrera blanda con forma de tazn. Debe estar hecho para adaptarse a su cuerpo. Se coloca en la vagina antes de tener sexo con una sustancia qumica que destruye los espermatozoides llamada espermicida. El Designer, fashion/clothing en la vagina durante 6 a 8 horas despus de tener sexo y debe retirarse en un plazo de 24 horas. El diafragma se debe reemplazar: Cada 1 o 2 aos. Despus de dar a luz. Despus de aumentar ms de 15 libras (6.8 kg). Si se somete a una ciruga en la pelvis. Capuchn cervical Este es un capuchn pequeo y [place] se fija sobre el cuello uterino. El cuello uterino es la parte ms baja del tero. Se coloca en la vagina antes del sexo, junto con un espermicida. El capuchn debe fabricarse para usted. El capuchn se debe dejar colocado durante 6 a 8 horas despus del sexo. Se debe retirar en un plazo de 48 horas. El capuchn cervical debe ser recetado y adaptado a su cuerpo por un mdico. Debe reemplazarse cada 2 aos. Esponja Esta es una esponja pequea que se coloca en la vagina antes de Mantador. Se debe dejar colocada durante al menos 6 horas despus de eBay. Se debe retirar en un plazo de 30 horas y desecharse. Espermicidas Son sustancias qumicas que destruyen o impiden que los espermatozoides ingresen al tero. Se pueden presentar en forma de pldora, crema, gel o espuma que se debe colocar en la vagina. Se deben usar al menos de 10 a 15 minutos antes de [employer]. Dispositivo intrauterino Un dispositivo intrauterino (DIU) es un dispositivo que un mdico coloca dentro del tero. Existen dos tipos: DIU hormonal. Este tipo  puede permanecer colocado durante 3 a 5 aos. DIU de cobre. Este tipo Insurance claims handler colocado durante 10 aos. Mtodos anticonceptivos permanentes Ligadura de trompas en la mujer Es una ciruga para obstruir las trompas de [place]. Esterilizacin masculina Es una ciruga, llamada vasectoma, para ligar los conductos que transportan los espermatozoides en los hombres. Este mtodo funciona al cabo de 3 meses. Se deben usar otros mtodos anticonceptivos durante 3 meses. Mtodos de planificacin natural Esto significa no tener [employer] la pareja femenina podra quedar embarazada. A continuacin se mencionan algunos mtodos anticonceptivos por planificacin natural: Usar un calendario a fin de: Hacer un seguimiento de la duracin de cada ciclo menstrual. Determinar en [employer] se podra producir Firefighter. Planificar no tener [employer] States [employer] en que se podra producir Firefighter. Reconocer los signos de la ovulacin y no tener relaciones sexuales durante ese perodo. La pareja femenina puede detectar cundo ser la ovulacin haciendo un seguimiento de su temperatura todos [place]. Tambin puede examinar si hay cambios en la mucosidad que proviene del cuello uterino. Dnde obtener ms informacin Centers for Disease Control and Prevention (Centros para el Control y la Prevencin de Enfermedades): TonerPromos.no. Luego:  Introduzca "birth control" o "anticonceptivos" en el cuadro de bsqueda. Esta informacin no tiene Theme park manager el consejo del mdico. Asegrese de hacerle al mdico cualquier pregunta que tenga. Document Revised: 01/06/2023 Document Reviewed: 01/06/2023 Elsevier Patient Education  2024 [employer].

## 2024-03-20 ENCOUNTER — Other Ambulatory Visit: Payer: Self-pay

## 2024-03-20 ENCOUNTER — Other Ambulatory Visit

## 2024-03-20 ENCOUNTER — Ambulatory Visit: Admitting: Obstetrics and Gynecology

## 2024-03-20 VITALS — BP 132/68 | HR 89 | Wt 228.0 lb

## 2024-03-20 DIAGNOSIS — Z3402 Encounter for supervision of normal first pregnancy, second trimester: Secondary | ICD-10-CM

## 2024-03-20 DIAGNOSIS — Z3403 Encounter for supervision of normal first pregnancy, third trimester: Secondary | ICD-10-CM

## 2024-03-20 DIAGNOSIS — E669 Obesity, unspecified: Secondary | ICD-10-CM

## 2024-03-20 DIAGNOSIS — Z3A28 28 weeks gestation of pregnancy: Secondary | ICD-10-CM

## 2024-03-20 NOTE — Progress Notes (Signed)
 PRENATAL VISIT NOTE  Subjective:  Kimberly Cummings is a 28 y.o. G1P0 at [redacted]w[redacted]d being seen today for ongoing prenatal care.  She is currently monitored for the following issues for this low-risk pregnancy and has Posttraumatic stress disorder; Atypical chest pain; Dyslipidemia; Snoring; Obesity (BMI 30-39.9); Encounter for supervision of normal first pregnancy in second trimester; and Psoriasis on their problem list.  Patient reports no complaints.  Contractions: Not present. Vag. Bleeding: None.  Movement: Present. Denies leaking of fluid.   The following portions of the patient's history were reviewed and updated as appropriate: allergies, current medications, past family history, past medical history, past social history, past surgical history and problem list.   Objective:   Vitals:   03/20/24 0856  BP: 132/68  Pulse: 89  Weight: 228 lb (103.4 kg)    Fetal Status:  Fetal Heart Rate (bpm): 143 Fundal Height: 29 cm Movement: Present    General: Alert, oriented and cooperative. Patient is in no acute distress.  Skin: Skin is warm and dry. No rash noted.   Cardiovascular: Normal heart rate noted  Respiratory: Normal respiratory effort, no problems with respiration noted  Abdomen: Soft, gravid, appropriate for gestational age.  Pain/Pressure: Absent     Pelvic: Cervical exam deferred        Extremities: Normal range of motion.  Edema: None  Mental Status: Normal mood and affect. Normal behavior. Normal judgment and thought content.      07/30/2019    3:57 PM 06/13/2019    8:27 AM 08/10/2018   11:46 AM  Depression screen PHQ 2/9  Decreased Interest 2 0 1  Down, Depressed, Hopeless 1 1 0  PHQ - 2 Score 3 1 1   Altered sleeping 3  2  Tired, decreased energy 0  2  Change in appetite 0  3  Feeling bad or failure about yourself  1  0  Trouble concentrating 3  0  Moving slowly or fidgety/restless 0  0  Suicidal thoughts 1  0  PHQ-9 Score 11  8        09/04/2019   10:11 AM  07/30/2019    3:57 PM 06/13/2019    8:27 AM 08/10/2018   11:46 AM  GAD 7 : Generalized Anxiety Score  Nervous, Anxious, on Edge 1 1 1 1   Control/stop worrying 1 2 0 0  Worry too much - different things 1 1 1  0  Trouble relaxing 1 0 1 0  Restless 0 0 0 2  Easily annoyed or irritable 1 3 0 2  Afraid - awful might happen 0 1 1 0  Total GAD 7 Score 5 8 4 5     Assessment and Plan:  Pregnancy: G1P0 at [redacted]w[redacted]d 1. [redacted] weeks gestation of pregnancy (Primary) 28wk labs today  2. Encounter for supervision of normal first pregnancy in second trimester  3. Obesity (BMI 30-39.9) F/u 12/5 completion anatomy u/s Weight stable  Preterm labor symptoms and general obstetric precautions including but not limited to vaginal bleeding, contractions, leaking of fluid and fetal movement were reviewed in detail with the patient. Please refer to After Visit Summary for other counseling recommendations.   Return in about 3 weeks (around 04/10/2024) for in person, low risk ob, md or app.  Future Appointments  Date Time Provider Department Center  03/20/2024  9:20 AM [facility]  04/01/2024  3:15 PM Cleatus Moccasin, MD [facility]  04/15/2024  4:15 PM Ilean Norleen GAILS, MD [facility]  04/19/2024 10:00 AM  [facility] PROVIDER 1 [facility]  04/19/2024 10:30 AM [facility] US3 [facility]  04/29/2024  4:15 PM Ilean Norleen GAILS, MD [facility]  05/13/2024  4:15 PM Zina Jerilynn LABOR, MD [facility]  05/20/2024  4:15 PM [facility] 2 [facility]  05/27/2024  4:15 PM [facility] 2 [facility]  06/03/2024  4:15 PM [facility] 2 [facility]  06/10/2024  4:15 PM [facility] 2 [facility]    Bebe Furry, MD

## 2024-03-21 ENCOUNTER — Ambulatory Visit: Payer: Self-pay | Admitting: Obstetrics and Gynecology

## 2024-03-21 DIAGNOSIS — Z3402 Encounter for supervision of normal first pregnancy, second trimester: Secondary | ICD-10-CM

## 2024-03-21 LAB — CBC
Hematocrit: 33.9 % — ABNORMAL LOW (ref 34.0–46.6)
Hemoglobin: 11.1 g/dL (ref 11.1–15.9)
MCH: 29 pg (ref 26.6–33.0)
MCHC: 32.7 g/dL (ref 31.5–35.7)
MCV: 89 fL (ref 79–97)
Platelets: 275 x10E3/uL (ref 150–450)
RBC: 3.83 x10E6/uL (ref 3.77–5.28)
RDW: 11.6 % — ABNORMAL LOW (ref 11.7–15.4)
WBC: 8.3 x10E3/uL (ref 3.4–10.8)

## 2024-03-21 LAB — HIV ANTIBODY (ROUTINE TESTING W REFLEX): HIV Screen 4th Generation wRfx: NONREACTIVE

## 2024-03-21 LAB — GLUCOSE TOLERANCE, 2 HOURS W/ 1HR
Glucose, 1 hour: 120 mg/dL (ref 70–179)
Glucose, 2 hour: 120 mg/dL (ref 70–152)
Glucose, Fasting: 65 mg/dL — ABNORMAL LOW (ref 70–91)

## 2024-03-21 LAB — RPR: RPR Ser Ql: NONREACTIVE

## 2024-04-01 ENCOUNTER — Ambulatory Visit: Admitting: Obstetrics and Gynecology

## 2024-04-01 ENCOUNTER — Other Ambulatory Visit: Payer: Self-pay

## 2024-04-01 VITALS — BP 119/72 | HR 91 | Wt 237.8 lb

## 2024-04-01 DIAGNOSIS — Z3402 Encounter for supervision of normal first pregnancy, second trimester: Secondary | ICD-10-CM

## 2024-04-01 DIAGNOSIS — Z3A29 29 weeks gestation of pregnancy: Secondary | ICD-10-CM | POA: Diagnosis not present

## 2024-04-01 DIAGNOSIS — E669 Obesity, unspecified: Secondary | ICD-10-CM | POA: Diagnosis not present

## 2024-04-01 DIAGNOSIS — Z23 Encounter for immunization: Secondary | ICD-10-CM

## 2024-04-01 NOTE — Progress Notes (Signed)
   PRENATAL VISIT NOTE  Subjective:  Kimberly Cummings is a 28 y.o. G1P0 at [redacted]w[redacted]d being seen today for ongoing prenatal care.  She is currently monitored for the following issues for this low-risk pregnancy and has Posttraumatic stress disorder; Atypical chest pain; Dyslipidemia; Snoring; Obesity (BMI 30-39.9); Encounter for supervision of normal first pregnancy in second trimester; and Psoriasis on their problem list.  Patient reports no complaints.  Contractions: Not present. Vag. Bleeding: None.  Movement: Present. Denies leaking of fluid.   The following portions of the patient's history were reviewed and updated as appropriate: allergies, current medications, past family history, past medical history, past social history, past surgical history and problem list.   Objective:   Vitals:   04/01/24 1513  BP: 119/72  Pulse: 91  Weight: 237 lb 12.8 oz (107.9 kg)    Fetal Status:  Fetal Heart Rate (bpm): 136   Movement: Present    General: Alert, oriented and cooperative. Patient is in no acute distress.  Skin: Skin is warm and dry. No rash noted.   Cardiovascular: Normal heart rate noted  Respiratory: Normal respiratory effort, no problems with respiration noted  Abdomen: Soft, gravid, appropriate for gestational age.  Pain/Pressure: Present     Pelvic: Cervical exam deferred        Extremities: Normal range of motion.  Edema: Trace  Mental Status: Normal mood and affect. Normal behavior. Normal judgment and thought content.     Assessment and Plan:  Pregnancy: G1P0 at [redacted]w[redacted]d 1. Encounter for supervision of normal first pregnancy in second trimester (Primary) 28w labs normal. Recommended tdap - pt accepts.  Has anatomy US  with MFM on 12/5.  Reviewed ideas around making a birth plan. She plans epidural. Plans eye ointment/Vit K.   2. Pregnancy with 29 completed weeks gestation  3. Obesity (BMI 30-39.9)   Preterm labor symptoms and general obstetric precautions including but  not limited to vaginal bleeding, contractions, leaking of fluid and fetal movement were reviewed in detail with the patient. Please refer to After Visit Summary for other counseling recommendations.   No follow-ups on file.  Future Appointments  Date Time Provider Department Center  04/15/2024  4:15 PM Ilean Norleen GAILS, MD [facility]  04/19/2024 10:00 AM [facility] PROVIDER 1 [facility]  04/19/2024 10:30 AM [facility] US3 [facility]  04/29/2024  4:15 PM Ilean Norleen GAILS, MD [facility]  05/13/2024  4:15 PM Zina Jerilynn LABOR, MD [facility]  05/20/2024  4:15 PM Jomarie Charlie LABOR, MD [facility]  05/27/2024  4:15 PM Eveline Lynwood MATSU, MD [facility]  06/03/2024  4:15 PM Nicholaus Burnard HERO, MD [facility]  06/10/2024  4:15 PM Davis, Devon E, PA-C [facility]    Vina Solian, MD

## 2024-04-15 ENCOUNTER — Other Ambulatory Visit: Payer: Self-pay

## 2024-04-15 ENCOUNTER — Ambulatory Visit: Admitting: Family Medicine

## 2024-04-15 VITALS — BP 113/75 | HR 102 | Wt 242.0 lb

## 2024-04-15 DIAGNOSIS — Z3403 Encounter for supervision of normal first pregnancy, third trimester: Secondary | ICD-10-CM | POA: Diagnosis not present

## 2024-04-15 DIAGNOSIS — E669 Obesity, unspecified: Secondary | ICD-10-CM | POA: Diagnosis not present

## 2024-04-15 DIAGNOSIS — Z3A31 31 weeks gestation of pregnancy: Secondary | ICD-10-CM

## 2024-04-15 DIAGNOSIS — Z3402 Encounter for supervision of normal first pregnancy, second trimester: Secondary | ICD-10-CM

## 2024-04-15 NOTE — Patient Instructions (Signed)

## 2024-04-16 NOTE — Progress Notes (Signed)
 PRENATAL VISIT NOTE  Subjective:  Kimberly Cummings is a 28 y.o. G2P0010 at [redacted]w[redacted]d being seen today for ongoing prenatal care.  She is currently monitored for the following issues for this low-risk pregnancy and has Posttraumatic stress disorder; Atypical chest pain; Dyslipidemia; Obesity (BMI 30-39.9); Encounter for supervision of normal first pregnancy in second trimester; and Psoriasis on their problem list.  Patient reports no bleeding, no contractions, no cramping, and no leaking.  Contractions: Not present. Vag. Bleeding: None.  Movement: Present. Denies leaking of fluid.   The following portions of the patient's history were reviewed and updated as appropriate: allergies, current medications, past family history, past medical history, past social history, past surgical history and problem list.   Objective:   Vitals:   04/15/24 1629  BP: 113/75  Pulse: (!) 102  Weight: 242 lb (109.8 kg)    Fetal Status:  Fetal Heart Rate (bpm): 138   Movement: Present    General: Alert, oriented and cooperative. Patient is in no acute distress.  Skin: Skin is warm and dry. No rash noted.   Cardiovascular: Normal heart rate noted  Respiratory: Normal respiratory effort, no problems with respiration noted  Abdomen: Soft, gravid, appropriate for gestational age.  Pain/Pressure: Present (round ligament pain)     Pelvic: Cervical exam deferred        Extremities: Normal range of motion.  Edema: Trace (bil LE and hands)  Mental Status: Normal mood and affect. Normal behavior. Normal judgment and thought content.      07/30/2019    3:57 PM 06/13/2019    8:27 AM 08/10/2018   11:46 AM  Depression screen PHQ 2/9  Decreased Interest 2 0 1  Down, Depressed, Hopeless 1 1 0  PHQ - 2 Score 3 1 1   Altered sleeping 3  2  Tired, decreased energy 0  2  Change in appetite 0  3  Feeling bad or failure about yourself  1  0  Trouble concentrating 3  0  Moving slowly or fidgety/restless 0  0  Suicidal  thoughts 1  0  PHQ-9 Score 11   8      Data saved with a previous flowsheet row definition        09/04/2019   10:11 AM 07/30/2019    3:57 PM 06/13/2019    8:27 AM 08/10/2018   11:46 AM  GAD 7 : Generalized Anxiety Score  Nervous, Anxious, on Edge 1 1 1 1   Control/stop worrying 1 2 0 0  Worry too much - different things 1 1 1  0  Trouble relaxing 1 0 1 0  Restless 0 0 0 2  Easily annoyed or irritable 1 3 0 2  Afraid - awful might happen 0 1 1 0  Total GAD 7 Score 5 8 4 5     Assessment and Plan:  Pregnancy: G2P0010 at [redacted]w[redacted]d 1. Encounter for supervision of normal first pregnancy in second trimester (Primary) FHR and BP are appropriate today Patient has anatomy scan scheduled for 12/5 I reviewed 28-week labs and normal values  2. Obesity (BMI 30-39.9)   3. [redacted] weeks gestation of pregnancy   Preterm labor symptoms and general obstetric precautions including but not limited to vaginal bleeding, contractions, leaking of fluid and fetal movement were reviewed in detail with the patient. Please refer to After Visit Summary for other counseling recommendations.   No follow-ups on file.  Future Appointments  Date Time Provider Department Center  04/19/2024 10:00 AM [facility] PROVIDER 1 [facility]  [facility]  04/19/2024 10:30 AM [facility] US3 [facility]  04/29/2024  4:15 PM Ilean Norleen GAILS, MD [facility]  05/13/2024  4:15 PM Zina Jerilynn LABOR, MD [facility]  05/20/2024  4:15 PM Jomarie Charlie LABOR, MD [facility]  05/27/2024  4:15 PM Eveline Lynwood MATSU, MD [facility]  06/03/2024  4:15 PM Nicholaus Burnard HERO, MD [facility]  06/10/2024  4:15 PM Davis, Devon E, PA-C [facility]    Braeton Wolgamott V Kaden Daughdrill, MD

## 2024-04-17 DIAGNOSIS — L400 Psoriasis vulgaris: Secondary | ICD-10-CM | POA: Diagnosis not present

## 2024-04-19 ENCOUNTER — Other Ambulatory Visit

## 2024-04-19 ENCOUNTER — Other Ambulatory Visit: Payer: Self-pay | Admitting: *Deleted

## 2024-04-19 ENCOUNTER — Ambulatory Visit: Attending: Family Medicine

## 2024-04-19 ENCOUNTER — Ambulatory Visit: Payer: Self-pay | Admitting: Family Medicine

## 2024-04-19 VITALS — BP 123/69 | HR 98

## 2024-04-19 DIAGNOSIS — O99213 Obesity complicating pregnancy, third trimester: Secondary | ICD-10-CM | POA: Diagnosis not present

## 2024-04-19 DIAGNOSIS — Z3402 Encounter for supervision of normal first pregnancy, second trimester: Secondary | ICD-10-CM

## 2024-04-19 DIAGNOSIS — Z3A32 32 weeks gestation of pregnancy: Secondary | ICD-10-CM | POA: Diagnosis not present

## 2024-04-19 DIAGNOSIS — E669 Obesity, unspecified: Secondary | ICD-10-CM

## 2024-04-19 DIAGNOSIS — O3663X0 Maternal care for excessive fetal growth, third trimester, not applicable or unspecified: Secondary | ICD-10-CM

## 2024-04-19 DIAGNOSIS — O3660X0 Maternal care for excessive fetal growth, unspecified trimester, not applicable or unspecified: Secondary | ICD-10-CM

## 2024-04-19 DIAGNOSIS — Z3689 Encounter for other specified antenatal screening: Secondary | ICD-10-CM

## 2024-04-19 DIAGNOSIS — Z363 Encounter for antenatal screening for malformations: Secondary | ICD-10-CM | POA: Diagnosis not present

## 2024-04-19 NOTE — Progress Notes (Signed)
 MFM Consult Note  Kimberly Cummings is currently at [redacted]w[redacted]d. She was seen today for a detailed fetal anatomy scan as she recently transferred her care from [facility] and due to maternal obesity with a BMI of 31.6.  She denies any problems in her current pregnancy and has screened negative for gestational diabetes.    She had a cell free DNA test earlier in her pregnancy which indicated a low risk for trisomy 72, 29, and 13. A female fetus is predicted.   Sonographic findings Single intrauterine pregnancy at 32w 2d.  Fetal cardiac activity:  Observed and appears normal. Presentation: Cephalic. The views of the fetal anatomy were limited today due to her advanced gestational age.  What was visualized today appeared within normal limits. Fetal biometry shows the estimated fetal weight of 5 lb 9 oz,  2518g (98%). Amniotic fluid volume: Within normal limits. AFI: 13.47cm.  MVP: 6.32 cm. Placenta: Posterior.  The patient was informed that anomalies may be missed due to technical limitations. If the fetus is in a suboptimal position or maternal habitus is increased, visualization of the fetus in the maternal uterus may be impaired.  Due to the large for gestational age fetus noted on today's exam, a follow-up growth scan was scheduled in 5 weeks.    Should a large for gestational age fetus continue to be noted at her next exam, delivery should be considered at around 39 weeks.  The patient stated that all of her questions were answered today.  A total of 30 minutes was spent counseling and coordinating the care for this patient.  Greater than 50% of the time was spent in direct face-to-face contact.

## 2024-04-29 ENCOUNTER — Ambulatory Visit: Admitting: Family Medicine

## 2024-04-29 ENCOUNTER — Other Ambulatory Visit: Payer: Self-pay

## 2024-04-29 VITALS — BP 123/83 | HR 108 | Wt 248.2 lb

## 2024-04-29 DIAGNOSIS — O3663X0 Maternal care for excessive fetal growth, third trimester, not applicable or unspecified: Secondary | ICD-10-CM

## 2024-04-29 DIAGNOSIS — Z3402 Encounter for supervision of normal first pregnancy, second trimester: Secondary | ICD-10-CM

## 2024-04-29 DIAGNOSIS — E669 Obesity, unspecified: Secondary | ICD-10-CM

## 2024-04-29 DIAGNOSIS — Z3A33 33 weeks gestation of pregnancy: Secondary | ICD-10-CM | POA: Diagnosis not present

## 2024-04-30 NOTE — Progress Notes (Signed)
 PRENATAL VISIT NOTE  Subjective:  Kimberly Cummings is a 28 y.o. G2P0010 at [redacted]w[redacted]d being seen today for ongoing prenatal care.  She is currently monitored for the following issues for this low-risk pregnancy and has Posttraumatic stress disorder; Atypical chest pain; Dyslipidemia; Obesity (BMI 30-39.9); Encounter for supervision of normal first pregnancy in second trimester; Psoriasis; and LGA (large for gestational age) fetus affecting management of mother on their problem list.  Patient reports no bleeding, no contractions, no cramping, and no leaking.  Contractions: Not present. Vag. Bleeding: None.  Movement: Present. Denies leaking of fluid.   The following portions of the patient's history were reviewed and updated as appropriate: allergies, current medications, past family history, past medical history, past social history, past surgical history and problem list.   Objective:   Vitals:   04/29/24 1632  BP: 123/83  Pulse: (!) 108  Weight: 248 lb 3.2 oz (112.6 kg)    Fetal Status:  Fetal Heart Rate (bpm): 154   Movement: Present    General: Alert, oriented and cooperative. Patient is in no acute distress.  Skin: Skin is warm and dry. No rash noted.   Cardiovascular: Normal heart rate noted  Respiratory: Normal respiratory effort, no problems with respiration noted  Abdomen: Soft, gravid, appropriate for gestational age.  Pain/Pressure: Absent     Pelvic: Cervical exam deferred        Extremities: Normal range of motion.  Edema: Trace  Mental Status: Normal mood and affect. Normal behavior. Normal judgment and thought content.      07/30/2019    3:57 PM 06/13/2019    8:27 AM 08/10/2018   11:46 AM  Depression screen PHQ 2/9  Decreased Interest 2 0 1  Down, Depressed, Hopeless 1 1 0  PHQ - 2 Score 3 1 1   Altered sleeping 3  2  Tired, decreased energy 0  2  Change in appetite 0  3  Feeling bad or failure about yourself  1  0  Trouble concentrating 3  0  Moving slowly or  fidgety/restless 0  0  Suicidal thoughts 1  0  PHQ-9 Score 11   8      Data saved with a previous flowsheet row definition        09/04/2019   10:11 AM 07/30/2019    3:57 PM 06/13/2019    8:27 AM 08/10/2018   11:46 AM  GAD 7 : Generalized Anxiety Score  Nervous, Anxious, on Edge 1 1 1 1   Control/stop worrying 1 2 0 0  Worry too much - different things 1 1 1  0  Trouble relaxing 1 0 1 0  Restless 0 0 0 2  Easily annoyed or irritable 1 3 0 2  Afraid - awful might happen 0 1 1 0  Total GAD 7 Score 5 8 4 5     Assessment and Plan:  Pregnancy: G2P0010 at [redacted]w[redacted]d 1. Encounter for supervision of normal first pregnancy in second trimester (Primary) FHR BP appropriate today  2. Excessive fetal growth affecting management of pregnancy in third trimester, single or unspecified fetus EFW 98th percentile on 12/5.  Planning on repeat growth scan early January and if still LGA would recommend delivery at 39 weeks  3. Obesity (BMI 30-39.9)  4. [redacted] weeks gestation of pregnancy  Preterm labor symptoms and general obstetric precautions including but not limited to vaginal bleeding, contractions, leaking of fluid and fetal movement were reviewed in detail with the patient. Please refer to After Visit Summary for  other counseling recommendations.   No follow-ups on file.  Future Appointments  Date Time Provider Department Center  05/13/2024  4:15 PM Zina Jerilynn LABOR, MD [facility]  05/20/2024  4:15 PM Jomarie Charlie LABOR, MD [facility]  05/27/2024  4:15 PM Eveline Lynwood MATSU, MD [facility]  05/29/2024  2:15 PM [facility] PROVIDER 1 [facility]  05/29/2024  2:30 PM [facility] US5 [facility]  06/03/2024  4:15 PM Nicholaus Burnard HERO, MD [facility]  06/10/2024  4:15 PM Davis, Devon E, PA-C [facility]    Norleen LULLA Rover, MD

## 2024-05-13 ENCOUNTER — Other Ambulatory Visit: Payer: Self-pay

## 2024-05-13 ENCOUNTER — Other Ambulatory Visit (HOSPITAL_COMMUNITY)
Admission: RE | Admit: 2024-05-13 | Discharge: 2024-05-13 | Disposition: A | Discharge status: Home / Self Care | Source: Ambulatory Visit | Attending: Obstetrics and Gynecology | Admitting: Obstetrics and Gynecology

## 2024-05-13 ENCOUNTER — Ambulatory Visit: Admitting: Obstetrics and Gynecology

## 2024-05-13 VITALS — BP 135/78 | HR 83 | Wt 255.8 lb

## 2024-05-13 DIAGNOSIS — Z3A35 35 weeks gestation of pregnancy: Secondary | ICD-10-CM | POA: Diagnosis not present

## 2024-05-13 DIAGNOSIS — Z3402 Encounter for supervision of normal first pregnancy, second trimester: Secondary | ICD-10-CM | POA: Diagnosis not present

## 2024-05-13 DIAGNOSIS — O3663X0 Maternal care for excessive fetal growth, third trimester, not applicable or unspecified: Secondary | ICD-10-CM | POA: Diagnosis not present

## 2024-05-13 NOTE — Progress Notes (Signed)
" ° °  PRENATAL VISIT NOTE  Subjective:  Kimberly Cummings is a 28 y.o. G2P0010 at [redacted]w[redacted]d being seen today for ongoing prenatal care.  She is currently monitored for the following issues for this low-risk pregnancy and has Posttraumatic stress disorder; Atypical chest pain; Dyslipidemia; Obesity (BMI 30-39.9); Encounter for supervision of normal first pregnancy in second trimester; Psoriasis; and LGA (large for gestational age) fetus affecting management of mother on their problem list.  Patient doing well with no acute concerns today. She reports occasional contractions.  Contractions: Irritability. Vag. Bleeding: None.  Movement: Present. Denies leaking of fluid.   The following portions of the patient's history were reviewed and updated as appropriate: allergies, current medications, past family history, past medical history, past social history, past surgical history and problem list. Problem list updated.  Objective:   Vitals:   05/13/24 1633  BP: 135/78  Pulse: 83  Weight: 255 lb 12.8 oz (116 kg)    Fetal Status: Fetal Heart Rate (bpm): 125 Fundal Height: 37 cm Movement: Present     General:  Alert, oriented and cooperative. Patient is in no acute distress.  Skin: Skin is warm and dry. No rash noted.   Cardiovascular: Normal heart rate noted  Respiratory: Normal respiratory effort, no problems with respiration noted  Abdomen: Soft, gravid, appropriate for gestational age.  Pain/Pressure: Present     Pelvic: Cervical exam performed Dilation: Fingertip Effacement (%): 30 Station: Ballotable  Extremities: Normal range of motion.  Edema: Trace  Mental Status:  Normal mood and affect. Normal behavior. Normal judgment and thought content.   Assessment and Plan:  Pregnancy: G2P0010 at [redacted]w[redacted]d  1. Encounter for supervision of normal first pregnancy in second trimester (Primary) Continue routine prenatal care  - GC/Chlamydia probe amp ([place])not at [facility] - Culture, beta strep (group  b only)  2. [redacted] weeks gestation of pregnancy   3. Excessive fetal growth affecting management of pregnancy in third trimester, single or unspecified fetus Last EFW 98%,  Repeat growth scan scheduled for 05/29/24, if macrosomia confirmed consider delivery at 39 weeks  Preterm labor symptoms and general obstetric precautions including but not limited to vaginal bleeding, contractions, leaking of fluid and fetal movement were reviewed in detail with the patient.  Please refer to After Visit Summary for other counseling recommendations.   Return in about 1 week (around 05/20/2024) for ROB, in person.   Jerilynn Buddle, MD Faculty Attending Center for [facility]   "

## 2024-05-14 DIAGNOSIS — Z3402 Encounter for supervision of normal first pregnancy, second trimester: Secondary | ICD-10-CM | POA: Diagnosis not present

## 2024-05-15 LAB — GC/CHLAMYDIA PROBE AMP (~~LOC~~) NOT AT ARMC
Chlamydia: NEGATIVE
Comment: NEGATIVE
Comment: NORMAL
Neisseria Gonorrhea: NEGATIVE

## 2024-05-16 NOTE — L&D Delivery Note (Signed)
 OB/GYN Faculty Practice Delivery Note  Kimberly Cummings is a 29 y.o. G2P0010 s/p VAVD at [redacted]w[redacted]d. She was admitted for IOL for suspected macrosomia. Diagnosed with mild preE intrapartum.   ROM: 12h 27m with mec stained fluid GBS Status:  Negative/-- (12/30 0856) Maximum Maternal Temperature: wnl  Labor Progress: Initial SVE: 2 cm. She then progressed to complete with cytotec , pitocin , and AROM augmentation  Delivery Date/Time: 17:19 06/06/24 Delivery: Called to room and patient was complete and pushing. Outlet vacuum performed for persistent bradycardia to the 80s - delivered with one pull, no pop-off. Nuchal x1, delivered through.  Shoulder and body delivered in usual fashion. Infant dusky with poor tone and poor respiratory effort - delayed cord clamping deferred and so cord was clamped and cut and given to NICU team.  Cord blood and cord gas drawn. Placenta delivered spontaneously with gentle cord traction. Fundus firm with massage and Pitocin  and buccal cytotec . Labia, perineum, vagina, and cervix inspected inspected with First degree perineal and left hymenal lacerations, both bleeding so both repaired with 2-0 vicryl in standard fashion.  Baby Weight: pending  Placenta: Sent to L&D Complications: vacuum-assisted delivery Lacerations: 1st degree perineal and left hymenal, both repaired EBL: 300 mL Analgesia: Epidural   Infant:  APGAR (1 MIN):   APGAR (5 MINS):   APGAR (10 MINS):     Devaughn Ban, MD Center for [facility] 06/06/2024, 5:53 PM

## 2024-05-17 ENCOUNTER — Ambulatory Visit: Payer: Self-pay | Admitting: Obstetrics and Gynecology

## 2024-05-18 LAB — CULTURE, BETA STREP (GROUP B ONLY): Strep Gp B Culture: NEGATIVE

## 2024-05-20 ENCOUNTER — Ambulatory Visit (INDEPENDENT_AMBULATORY_CARE_PROVIDER_SITE_OTHER)

## 2024-05-20 ENCOUNTER — Other Ambulatory Visit: Payer: Self-pay

## 2024-05-20 VITALS — BP 129/84 | HR 105 | Wt 261.0 lb

## 2024-05-20 DIAGNOSIS — Z3A36 36 weeks gestation of pregnancy: Secondary | ICD-10-CM | POA: Diagnosis not present

## 2024-05-20 DIAGNOSIS — Z3403 Encounter for supervision of normal first pregnancy, third trimester: Secondary | ICD-10-CM | POA: Diagnosis not present

## 2024-05-20 DIAGNOSIS — Z3402 Encounter for supervision of normal first pregnancy, second trimester: Secondary | ICD-10-CM

## 2024-05-20 DIAGNOSIS — Z2911 Encounter for prophylactic immunotherapy for respiratory syncytial virus (RSV): Secondary | ICD-10-CM | POA: Diagnosis not present

## 2024-05-20 NOTE — Patient Instructions (Signed)

## 2024-05-20 NOTE — Progress Notes (Signed)
" ° °  PRENATAL VISIT NOTE  Subjective:  Kimberly Cummings is a 29 y.o. G2P0010 at [redacted]w[redacted]d being seen today for ongoing prenatal care.  She is currently monitored for the following issues for this low-risk pregnancy and has Posttraumatic stress disorder; Atypical chest pain; Dyslipidemia; Obesity (BMI 30-39.9); Encounter for supervision of normal first pregnancy in second trimester; Psoriasis; and LGA (large for gestational age) fetus affecting management of mother on their problem list.  Patient reports no complaints.  Contractions: Irritability. Vag. Bleeding: None.  Movement: Present. Denies leaking of fluid.   The following portions of the patient's history were reviewed and updated as appropriate: allergies, current medications, past family history, past medical history, past social history, past surgical history and problem list.   Objective:    Vitals:   05/20/24 1618  BP: 129/84  Pulse: (!) 105  Weight: 261 lb (118.4 kg)    Fetal Status:  Fetal Heart Rate (bpm): 152   Movement: Present    General: Alert, oriented and cooperative. Patient is in no acute distress.  Skin: Skin is warm and dry. No rash noted.   Cardiovascular: Normal heart rate noted  Respiratory: Normal respiratory effort, no problems with respiration noted  Abdomen: Soft, gravid, appropriate for gestational age.  Pain/Pressure: Present (pressure pelvic)     Pelvic: Cervical exam deferred        Extremities: Normal range of motion.  Edema: Trace (bil LE)  Mental Status: Normal mood and affect. Normal behavior. Normal judgment and thought content.   Assessment and Plan:  Pregnancy: G2P0010 at [redacted]w[redacted]d 1. Encounter for supervision of normal first pregnancy in second trimester (Primary) - Questions answered re: LGA fetus, plan for US  on 1/14 with MFM - Discussed work accommodations and process for getting paperwork completed   2. Need for RSV vaccination R/B/A conversation, would like vaccine. Given today. -  Respiratory syncytial virus vaccine, preF, subunit, bivalent,(Abrysvo)  Preterm labor symptoms and general obstetric precautions including but not limited to vaginal bleeding, contractions, leaking of fluid and fetal movement were reviewed in detail with the patient. Please refer to After Visit Summary for other counseling recommendations.   Return in about 1 week (around 05/27/2024) for Routine prenatal visit.  Future Appointments  Date Time Provider Department Center  05/27/2024  4:15 PM Eveline Lynwood MATSU, MD [facility]  05/29/2024  2:15 PM [facility] PROVIDER 1 [facility]  05/29/2024  2:30 PM [facility] US5 [facility]  06/03/2024  4:15 PM Nicholaus Burnard HERO, MD [facility]  06/10/2024  4:15 PM Davis, Devon E, PA-C [facility]    Charlie DELENA Courts, MD  "

## 2024-05-27 ENCOUNTER — Ambulatory Visit: Admitting: Obstetrics & Gynecology

## 2024-05-27 ENCOUNTER — Other Ambulatory Visit: Payer: Self-pay

## 2024-05-27 VITALS — BP 136/89 | HR 78 | Wt 268.0 lb

## 2024-05-27 DIAGNOSIS — Z3A37 37 weeks gestation of pregnancy: Secondary | ICD-10-CM

## 2024-05-27 DIAGNOSIS — Z3402 Encounter for supervision of normal first pregnancy, second trimester: Secondary | ICD-10-CM

## 2024-05-27 DIAGNOSIS — E669 Obesity, unspecified: Secondary | ICD-10-CM

## 2024-05-27 DIAGNOSIS — O3663X0 Maternal care for excessive fetal growth, third trimester, not applicable or unspecified: Secondary | ICD-10-CM | POA: Diagnosis not present

## 2024-05-27 NOTE — Progress Notes (Signed)
 "  PRENATAL VISIT NOTE  Subjective:  Kimberly Cummings is a 29 y.o. G2P0010 at [redacted]w[redacted]d being seen today for ongoing prenatal care.  She is currently monitored for the following issues for this high-risk pregnancy and has Posttraumatic stress disorder; Atypical chest pain; Dyslipidemia; Obesity (BMI 30-39.9); Encounter for supervision of normal first pregnancy in second trimester; Psoriasis; and LGA (large for gestational age) fetus affecting management of mother on their problem list.  Patient reports no complaints.  Contractions: Not present. Vag. Bleeding: None.  Movement: Present. Denies leaking of fluid.   The following portions of the patient's history were reviewed and updated as appropriate: allergies, current medications, past family history, past medical history, past social history, past surgical history and problem list.   Objective:   Vitals:   05/27/24 1618 05/27/24 1629  BP: (!) 142/88 136/89  Pulse: 78 78  Weight: 268 lb (121.6 kg)     Fetal Status:  Fetal Heart Rate (bpm): 129   Movement: Present    General: Alert, oriented and cooperative. Patient is in no acute distress.  Skin: Skin is warm and dry. No rash noted.   Cardiovascular: Normal heart rate noted  Respiratory: Normal respiratory effort, no problems with respiration noted  Abdomen: Soft, gravid, appropriate for gestational age.  Pain/Pressure: Present     Pelvic: Cervical exam deferred        Extremities: Normal range of motion.  Edema: Moderate pitting, indentation subsides rapidly  Mental Status: Normal mood and affect. Normal behavior. Normal judgment and thought content.      07/30/2019    3:57 PM 06/13/2019    8:27 AM 08/10/2018   11:46 AM  Depression screen PHQ 2/9  Decreased Interest 2 0 1  Down, Depressed, Hopeless 1 1 0  PHQ - 2 Score 3 1 1   Altered sleeping 3  2  Tired, decreased energy 0  2  Change in appetite 0  3  Feeling bad or failure about yourself  1  0  Trouble concentrating 3  0   Moving slowly or fidgety/restless 0  0  Suicidal thoughts 1  0  PHQ-9 Score 11   8      Data saved with a previous flowsheet row definition        09/04/2019   10:11 AM 07/30/2019    3:57 PM 06/13/2019    8:27 AM 08/10/2018   11:46 AM  GAD 7 : Generalized Anxiety Score  Nervous, Anxious, on Edge 1 1 1 1   Control/stop worrying 1 2 0 0  Worry too much - different things 1 1 1  0  Trouble relaxing 1 0 1 0  Restless 0 0 0 2  Easily annoyed or irritable 1 3 0 2  Afraid - awful might happen 0 1 1 0  Total GAD 7 Score 5 8 4 5     Assessment and Plan:  Pregnancy: G2P0010 at [redacted]w[redacted]d 1. Encounter for supervision of normal first pregnancy in second trimester (Primary) Follow borderline BP with MFM visit in 2 days  2. [redacted] weeks gestation of pregnancy [redacted]w[redacted]d  3. Excessive fetal growth affecting management of pregnancy in third trimester, single or unspecified fetus   4. Obesity (BMI 30-39.9)   Term labor symptoms and general obstetric precautions including but not limited to vaginal bleeding, contractions, leaking of fluid and fetal movement were reviewed in detail with the patient. Please refer to After Visit Summary for other counseling recommendations.   Return in about 1 week (around 06/03/2024).  Future  Appointments  Date Time Provider Department Center  05/29/2024  2:15 PM [facility] PROVIDER 1 [facility]  05/29/2024  2:30 PM [facility] US5 [facility]  06/06/2024 10:35 AM Letha Renshaw, CNM [facility] None    Lynwood Solomons, MD "

## 2024-05-29 ENCOUNTER — Encounter: Payer: Self-pay | Admitting: Obstetrics & Gynecology

## 2024-05-29 ENCOUNTER — Ambulatory Visit (HOSPITAL_BASED_OUTPATIENT_CLINIC_OR_DEPARTMENT_OTHER)

## 2024-05-29 ENCOUNTER — Ambulatory Visit: Attending: Obstetrics and Gynecology | Admitting: Obstetrics and Gynecology

## 2024-05-29 VITALS — BP 131/64 | HR 61

## 2024-05-29 DIAGNOSIS — E669 Obesity, unspecified: Secondary | ICD-10-CM

## 2024-05-29 DIAGNOSIS — Z3A38 38 weeks gestation of pregnancy: Secondary | ICD-10-CM | POA: Diagnosis not present

## 2024-05-29 DIAGNOSIS — Z0289 Encounter for other administrative examinations: Secondary | ICD-10-CM

## 2024-05-29 DIAGNOSIS — O99213 Obesity complicating pregnancy, third trimester: Secondary | ICD-10-CM

## 2024-05-29 DIAGNOSIS — O3663X0 Maternal care for excessive fetal growth, third trimester, not applicable or unspecified: Secondary | ICD-10-CM | POA: Insufficient documentation

## 2024-05-29 NOTE — Progress Notes (Signed)
 Maternal-Fetal Medicine Consultation  Name: Zemira Zehring  MRN: [id number]  GA: G2P0010 [redacted]w[redacted]d   Patient is here for fetal growth assessment and estimation of fetal weight.  Large for gestational age fetus was suspected at previous ultrasound.  Patient does not have gestational diabetes.  Blood pressure today at our office is 131/64 mmHg.  Ultrasound The estimated fetal weight and the abdominal circumference measurements are at the 99th percentiles.  The estimated fetal weight is 4,355 grams (9 pounds and 10 ounces).  Normal amniotic fluid.  Cephalic presentation.  Suspected fetal macrosomia I counseled the patient on the following: -Findings are consistent with fetal macrosomia. -Ultrasound has limitations in accurately estimating fetal weights and has a very low positive predictive value. -Birth weight is over 5,000 g in the absence of gestational diabetes carry an increased risk for perinatal mortality and morbidity. -Fetal weight is predicted to be around 4,600 grams (10lbs 3 oz) at 39-weeks' gestation. -Consistent with ACOG recommendations, we recommend cesarean delivery for estimated fetal weight of 5000 g in the absence of gestational diabetes. -Induction of labor is not recommended before [redacted] weeks gestation for macrosomia alone. Patient would like to attempt vaginal delivery at 39 weeks' gestation. She will be contacting your office before her prenatal visit appointment.  Recommendations No follow-up appointments were made     Consultation including face-to-face (more than 50%) counseling 20 minutes.

## 2024-05-30 ENCOUNTER — Telehealth: Payer: Self-pay | Admitting: *Deleted

## 2024-05-30 NOTE — Telephone Encounter (Signed)
 Called pt following her Mychart message from earlier today. Pt was advised that I had reached out to Dr. Eveline and that he agrees she needs to be scheduled for induction of labor @ 39 wks for the reason of large for gestational age infant. Pt voiced understanding and agreed to morning or evening induction appointment on 06/05/24. Instructions given and she will check Mychart for the appointment.

## 2024-06-03 ENCOUNTER — Encounter (HOSPITAL_COMMUNITY): Payer: Self-pay | Admitting: *Deleted

## 2024-06-03 ENCOUNTER — Telehealth (HOSPITAL_COMMUNITY): Payer: Self-pay | Admitting: *Deleted

## 2024-06-03 ENCOUNTER — Encounter: Admitting: Obstetrics and Gynecology

## 2024-06-03 NOTE — Telephone Encounter (Signed)
 Preadmission screen

## 2024-06-05 ENCOUNTER — Inpatient Hospital Stay (HOSPITAL_COMMUNITY)

## 2024-06-05 ENCOUNTER — Inpatient Hospital Stay (HOSPITAL_COMMUNITY)
Admission: RE | Admit: 2024-06-05 | Source: Home / Self Care | Attending: Obstetrics and Gynecology | Admitting: Obstetrics and Gynecology

## 2024-06-05 ENCOUNTER — Encounter (HOSPITAL_COMMUNITY): Payer: Self-pay | Admitting: Obstetrics & Gynecology

## 2024-06-05 ENCOUNTER — Other Ambulatory Visit: Payer: Self-pay

## 2024-06-05 DIAGNOSIS — E669 Obesity, unspecified: Secondary | ICD-10-CM | POA: Diagnosis present

## 2024-06-05 DIAGNOSIS — O3660X0 Maternal care for excessive fetal growth, unspecified trimester, not applicable or unspecified: Secondary | ICD-10-CM | POA: Diagnosis present

## 2024-06-05 DIAGNOSIS — O1403 Mild to moderate pre-eclampsia, third trimester: Principal | ICD-10-CM | POA: Diagnosis not present

## 2024-06-05 LAB — PROTEIN / CREATININE RATIO, URINE
Creatinine, Urine: 44 mg/dL
Protein Creatinine Ratio: 0.4 mg/mg — ABNORMAL HIGH
Total Protein, Urine: 17 mg/dL

## 2024-06-05 LAB — CBC
HCT: 37.3 % (ref 36.0–46.0)
Hemoglobin: 12.3 g/dL (ref 12.0–15.0)
MCH: 28.3 pg (ref 26.0–34.0)
MCHC: 33 g/dL (ref 30.0–36.0)
MCV: 85.9 fL (ref 80.0–100.0)
Platelets: 174 K/uL (ref 150–400)
RBC: 4.34 MIL/uL (ref 3.87–5.11)
RDW: 13.3 % (ref 11.5–15.5)
WBC: 7.8 K/uL (ref 4.0–10.5)
nRBC: 0 % (ref 0.0–0.2)

## 2024-06-05 LAB — COMPREHENSIVE METABOLIC PANEL WITH GFR
ALT: 21 U/L (ref 0–44)
AST: 29 U/L (ref 15–41)
Albumin: 3.4 g/dL — ABNORMAL LOW (ref 3.5–5.0)
Alkaline Phosphatase: 220 U/L — ABNORMAL HIGH (ref 38–126)
Anion gap: 13 (ref 5–15)
BUN: 11 mg/dL (ref 6–20)
CO2: 22 mmol/L (ref 22–32)
Calcium: 9.2 mg/dL (ref 8.9–10.3)
Chloride: 102 mmol/L (ref 98–111)
Creatinine, Ser: 0.66 mg/dL (ref 0.44–1.00)
GFR, Estimated: >60 mL/min
Glucose, Bld: 97 mg/dL (ref 70–99)
Potassium: 4.2 mmol/L (ref 3.5–5.1)
Sodium: 137 mmol/L (ref 135–145)
Total Bilirubin: 0.2 mg/dL (ref 0.0–1.2)
Total Protein: 6.7 g/dL (ref 6.5–8.1)

## 2024-06-05 LAB — TYPE AND SCREEN
ABO/RH(D): B POS
Antibody Screen: NEGATIVE

## 2024-06-05 LAB — SYPHILIS: RPR W/REFLEX TO RPR TITER AND TREPONEMAL ANTIBODIES, TRADITIONAL SCREENING AND DIAGNOSIS ALGORITHM: RPR Ser Ql: NONREACTIVE

## 2024-06-05 MED ORDER — LACTATED RINGERS IV SOLN: INTRAVENOUS | Status: AC

## 2024-06-05 MED ORDER — MISOPROSTOL 25 MCG QUARTER TABLET: 25.0000 ug | ORAL_TABLET | ORAL | Status: DC | PRN

## 2024-06-05 MED ORDER — OXYTOCIN-SODIUM CHLORIDE 30-0.9 UT/500ML-% IV SOLN
1.0000 m[IU]/min | INTRAVENOUS | Status: DC
  Administered 2024-06-05: 2 m[IU]/min via INTRAVENOUS
  Filled 2024-06-05: qty 500

## 2024-06-05 MED ORDER — ONDANSETRON HCL 4 MG/2ML IJ SOLN
4.0000 mg | Freq: Four times a day (QID) | INTRAMUSCULAR | Status: DC | PRN
  Administered 2024-06-06: 4 mg via INTRAVENOUS
  Filled 2024-06-05: qty 2

## 2024-06-05 MED ORDER — TERBUTALINE SULFATE 1 MG/ML IJ SOLN: 0.2500 mg | Freq: Once | INTRAMUSCULAR | Status: DC | PRN

## 2024-06-05 MED ORDER — LACTATED RINGERS IV SOLN: 500.0000 mL | INTRAVENOUS | Status: AC | PRN

## 2024-06-05 MED ORDER — OXYTOCIN BOLUS FROM INFUSION
333.0000 mL | Freq: Once | INTRAVENOUS | Status: AC
  Administered 2024-06-06: 333 mL via INTRAVENOUS

## 2024-06-05 MED ORDER — SOD CITRATE-CITRIC ACID 500-334 MG/5ML PO SOLN: 30.0000 mL | ORAL | Status: DC | PRN

## 2024-06-05 MED ORDER — MISOPROSTOL 50MCG HALF TABLET
50.0000 ug | ORAL_TABLET | ORAL | Status: DC | PRN
  Administered 2024-06-05 (×2): 50 ug via BUCCAL
  Filled 2024-06-05 (×2): qty 1

## 2024-06-05 MED ORDER — OXYTOCIN-SODIUM CHLORIDE 30-0.9 UT/500ML-% IV SOLN
2.5000 [IU]/h | INTRAVENOUS | Status: DC
  Filled 2024-06-05: qty 500

## 2024-06-05 MED ORDER — MISOPROSTOL 25 MCG QUARTER TABLET
25.0000 ug | ORAL_TABLET | Freq: Once | ORAL | Status: AC
  Administered 2024-06-05: 25 ug via VAGINAL
  Filled 2024-06-05: qty 1

## 2024-06-05 MED ORDER — ACETAMINOPHEN 325 MG PO TABS: 650.0000 mg | ORAL_TABLET | ORAL | Status: DC | PRN

## 2024-06-05 MED ORDER — OXYCODONE-ACETAMINOPHEN 5-325 MG PO TABS: 2.0000 | ORAL_TABLET | ORAL | Status: DC | PRN

## 2024-06-05 MED ORDER — OXYCODONE-ACETAMINOPHEN 5-325 MG PO TABS
1.0000 | ORAL_TABLET | ORAL | Status: DC | PRN
  Administered 2024-06-06: 1 via ORAL
  Filled 2024-06-05: qty 1

## 2024-06-05 MED ORDER — MISOPROSTOL 50MCG HALF TABLET
50.0000 ug | ORAL_TABLET | Freq: Once | ORAL | Status: AC
  Administered 2024-06-05: 50 ug via ORAL
  Filled 2024-06-05: qty 1

## 2024-06-05 MED ORDER — LIDOCAINE HCL (PF) 1 % IJ SOLN: 30.0000 mL | INTRAMUSCULAR | Status: DC | PRN

## 2024-06-05 NOTE — H&P (Signed)
 OBSTETRIC ADMISSION HISTORY AND PHYSICAL  Kimberly Cummings is a 29 y.o. female G2P0010 with IUP at [redacted]w[redacted]d by US  presenting for eIOL for LGA. She reports +FMs, No LOF, no VB, no blurry vision, headaches or peripheral edema, and RUQ pain.  She plans on breast feeding. She request nexplanon for birth control. She received her prenatal care at [facility]   Dating: By US  --->  Estimated Date of Delivery: 06/12/24  Sono:    @[redacted]w[redacted]d , CWD, normal anatomy, cephalic presentation, fundal placenta, 4355g, 99% EFW   Prenatal History/Complications:  NURSING  PROVIDER  [employer] Dating by 7 wk US   [facility] Model   Anatomy U/S    Initiated care at  [employer]  English               LAB RESULTS   Support Person Jose FOB Genetics NIPS: Low risk AFP: normal      NT/IT (FT only)        Carrier Screen Horizon:   Rhogam  B/Positive/-- (06/16 0000) A1C/GTT Early HgbA1C:  Third trimester 2 hr GTT: neg  Flu Vaccine 01/24/24      TDaP Vaccine  04/01/24 Blood Type B/Positive/-- (06/16 0000)  RSV Vaccine Received 05/20/24 Antibody Negative (06/16 0000)  COVID Vaccine   Rubella Immune (06/16 0000)  Feeding Plan breast RPR Nonreactive (06/16 0000)  Contraception nexplanon HBsAg Negative (06/16 0000)  Circumcision Boy No  HIV Non-reactive (06/16 0000)  Pediatrician  List given 04/15/24 HCVAb Negative (06/16 0000)  Prenatal Classes        BTL Consent   Pap       Diagnosis  Date Value Ref Range Status  08/25/2022     Final    - Negative for intraepithelial lesion or malignancy (NILM)    BTL Pre-payment   GC/CT Initial:  neg/neg 36wks:    VBAC Consent   GBS For PCN allergy, check sensitivities   BRx Optimized? [ ]  yes   [ ]  no      DME Rx [ ]  BP cuff [ ]  Weight Scale Waterbirth  [ ]  Class [ ]  Consent [ ]  CNM visit  PHQ9 & GAD7 [  ] new OB [  ] 28 weeks  [  ] 36 weeks Induction  [ ]  Orders Entered [ ] Foley Y/N     Past Medical History: Past Medical  History:  Diagnosis Date   Overweight    PCOS (polycystic ovarian syndrome)    Psoriasis    PTSD (post-traumatic stress disorder)    Snoring 10/11/2017    Past Surgical History: Past Surgical History:  Procedure Laterality Date   NO PAST SURGERIES      Obstetrical History: OB History     Gravida  2   Para      Term      Preterm      AB  1   Living         SAB  1   IAB      Ectopic      Multiple      Live Births              Social History Social History   Socioeconomic History   Marital status: Unknown    Spouse name: Not on file   Number of children: Not on file   Years of education: Not on file  Highest education level: Not on file  Occupational History   Not on file  Tobacco Use   Smoking status: Never   Smokeless tobacco: Never  Vaping Use   Vaping status: Never Used  Substance and Sexual Activity   Alcohol use: Not Currently    Comment: occas.    Drug use: Never   Sexual activity: Yes  Other Topics Concern   Not on file  Social History Narrative   ** Merged History Encounter **       Social Drivers of Health   Tobacco Use: Low Risk (06/05/2024)   Patient History    Smoking Tobacco Use: Never    Smokeless Tobacco Use: Never    Passive Exposure: Not on file  Financial Resource Strain: Not on file  Food Insecurity: Not on file  Transportation Needs: Not on file  Physical Activity: Not on file  Stress: Not on file  Social Connections: Not on file  Depression (EYV7-0): Not on file  Alcohol Screen: Not on file  Housing: Not on file  Utilities: Not on file  Health Literacy: Not on file    Family History: Family History  Problem Relation Age of Onset   Hyperlipidemia Mother    Hypertension Father    Stroke Neg Hx    Heart attack Neg Hx     Allergies: Allergies[1]  Medications Prior to Admission  Medication Sig Dispense Refill Last Dose/Taking   Prenatal Vit-Fe Fumarate-FA (PRENATAL VITAMINS) 28-0.8 MG TABS 1  tablet Orally Once a day   06/04/2024     Review of Systems   All systems reviewed and negative except as stated in HPI  Last menstrual period 09/12/2023. General appearance: alert, cooperative, and no distress Lungs: clear to auscultation bilaterally Heart: regular rate and rhythm Abdomen: soft, non-tender; bowel sounds normal Extremities: Homans sign is negative, no sign of DVT DTR's 2+ Presentation: cephalic Fetal monitoringBaseline: 125 bpm, Variability: Good {> 6 bpm), Accelerations: Reactive, and Decelerations: Absent Uterine activity: irreg, mild Dilation: 2 Effacement (%): 60 Cervical Position: Posterior Station: -3 Presentation: Vertex Exam by:: Dorothyann Chester RN   Prenatal labs: ABO, Rh: B/Positive/-- (06/16 0000) Antibody: Negative (06/16 0000) Rubella: Immune (06/16 0000) RPR: Non Reactive (11/05 1020)  HBsAg: Negative (06/16 0000)  HIV: Non Reactive (11/05 1020)  GBS: Negative/-- (12/30 0856)    Lab Results  Component Value Date   GBS Negative 05/14/2024   GTT negative Genetic screening  NIPS low risk, AFP normal Anatomy US  fetal anatomy were limited today due to her advanced gestational age, fetal biometry shows the estimated fetal weight of 5 lb 9 oz, 2518g (98%).  Immunization History  Administered Date(s) Administered    sv, Bivalent, Protein Subunit Rsvpref,pf Marlow) 05/20/2024   HPV 9-valent 07/16/2009, 10/03/2023   Influenza, Seasonal, Injecte, Preservative Fre 01/24/2024   Tdap 04/01/2024    Prenatal Transfer Tool  Maternal Diabetes: No Genetic Screening: Normal Maternal Ultrasounds/Referrals: Other: LGA Fetal Ultrasounds or other Referrals:  Referred to Materal Fetal Medicine  LGA Maternal Substance Abuse:  No Significant Maternal Medications:  None Significant Maternal Lab Results: Group B Strep negative Number of Prenatal Visits:greater than 3 verified prenatal visits Maternal Vaccinations:RSV: Given during pregnancy >/=14 days  ago, TDap, and Flu Other Comments:  None   No results found for this or any previous visit (from the past 24 hours).  Patient Active Problem List   Diagnosis Date Noted   LGA (large for gestational age) fetus affecting management of mother 04/19/2024   Encounter for  supervision of normal first pregnancy in second trimester 03/07/2024   Psoriasis    Dyslipidemia 10/11/2017   Obesity (BMI 30-39.9) 10/11/2017   Posttraumatic stress disorder 09/19/2017   Atypical chest pain 09/19/2017    Assessment/Plan:  Kimberly Cummings is a 29 y.o. G2P0010 at [redacted]w[redacted]d here for eIOL for LGA  #Labor:IOL for LGA #Pain: Per patient request #FWB: Category I #GBS status:  negative #Feeding: Breastmilk  #Reproductive Life planning: Nexplanon #Circ:  no  Garen SHAUNNA Puffer, MD  06/05/2024, 6:56 AM   I was present for the exam and agree with above.  Kimberly Cummings , CNM 06/05/2024 12:10 PM      [1]  Allergies Allergen Reactions   Citrus Swelling    Tongue swelling with citrus fruits

## 2024-06-05 NOTE — Progress Notes (Signed)
 Samai Corea is a 29 y.o. G2P0010 at [redacted]w[redacted]d by ultrasound admitted for induction of labor due to LGA with mild PreE in the intrapartum.   Subjective: Patient overall doing well. Family at bedside and supportive. Patient reports mild abdominal cramping but endorses increased frequency.   Objective: BP 136/89   Pulse 70   Temp 98.2 F (36.8 C) (Oral)   Resp 16   Ht 5' 2 (1.575 m)   Wt 121.9 kg   LMP 09/12/2023 (Approximate)   BMI 49.16 kg/m  No intake/output data recorded. No intake/output data recorded.  FHT:  FHR: 130 bpm, variability: moderate,  accelerations:  Present,  decelerations:  Absent UC:   irregular, every 1-5 minutes SVE:   Dilation: 2.5 Effacement (%): 60 Station: -2 Exam by:: Shay Halton Neas,CNM  Labs: Lab Results  Component Value Date   WBC 7.8 06/05/2024   HGB 12.3 06/05/2024   HCT 37.3 06/05/2024   MCV 85.9 06/05/2024   PLT 174 06/05/2024    Assessment / Plan: Induction of labor due to LGA,  s/p 3 doses os Cytotec . Upon cervical exam cervix soft. Discussed with pt next options including repeating cytotec , vs Pit Vs AROM. Reviewed R/B/A of all options. Through shared decision making all parties agreeable to start pit 2x2.   Labor: Start Pit 2x2 and continue to titrate up as needed.  Preeclampsia:  labs stable and BPs remain elevated, however not severe range. No other PreE symptoms.  Fetal Wellbeing:  Category I Pain Control:  Labor support without medications I/D:  GBS negative  Anticipated MOD:  hopeful for vaginal birth   Claris CHRISTELLA Cedar, CNM 06/05/2024, 11:09 PM

## 2024-06-05 NOTE — Progress Notes (Signed)
 Kimberly Cummings is a 29 y.o. G2P0010 at [redacted]w[redacted]d.  Subjective: Mild cramping   Objective: BP 138/88   Pulse 62   Temp 98.2 F (36.8 C) (Oral)   Resp 16   Ht 5' 2 (1.575 m)   Wt 121.9 kg   LMP 09/12/2023 (Approximate)   BMI 49.16 kg/m    FHT:  FHR: 125 bpm, variability: mod,  accelerations:  15x15,  decelerations:  none UC:  UI Dilation: 2 Effacement (%): 60 Cervical Position: Posterior Station: -3 Presentation: Vertex Exam by:: ALONSO Sharps CNM  Labs: Results for orders placed or performed during the hospital encounter of 06/05/24 (from the past 24 hours)  CBC     Status: None   Collection Time: 06/05/24  6:48 AM  Result Value Ref Range   WBC 7.8 4.0 - 10.5 K/uL   RBC 4.34 3.87 - 5.11 MIL/uL   Hemoglobin 12.3 12.0 - 15.0 g/dL   HCT 62.6 63.9 - 53.9 %   MCV 85.9 80.0 - 100.0 fL   MCH 28.3 26.0 - 34.0 pg   MCHC 33.0 30.0 - 36.0 g/dL   RDW 86.6 88.4 - 84.4 %   Platelets 174 150 - 400 K/uL   nRBC 0.0 0.0 - 0.2 %  RPR     Status: None   Collection Time: 06/05/24  6:48 AM  Result Value Ref Range   RPR Ser Ql NON REACTIVE NON REACTIVE  Type and screen [facility]     Status: None   Collection Time: 06/05/24  7:16 AM  Result Value Ref Range   ABO/RH(D) B POS    Antibody Screen NEG    Sample Expiration      06/08/2024,2359 Performed at [facility], [address], [place], [place] [address]     Assessment / Plan: [redacted]w[redacted]d week IUP ROM x rupture date or rupture time have not been documented Labor: IOL for suspected fetal macrosomia/Early labor Fetal Wellbeing:  Category I Pain Control:  Comfort measures Anticipated MOD:  SVD   Sharps Raker , CNM 06/05/2024 12:25 PM

## 2024-06-06 ENCOUNTER — Encounter: Payer: Self-pay | Admitting: Obstetrics and Gynecology

## 2024-06-06 ENCOUNTER — Inpatient Hospital Stay (HOSPITAL_COMMUNITY): Admitting: Anesthesiology

## 2024-06-06 LAB — COMPREHENSIVE METABOLIC PANEL WITH GFR
ALT: 22 U/L (ref 0–44)
AST: 36 U/L (ref 15–41)
Albumin: 3.3 g/dL — ABNORMAL LOW (ref 3.5–5.0)
Alkaline Phosphatase: 235 U/L — ABNORMAL HIGH (ref 38–126)
Anion gap: 15 (ref 5–15)
BUN: 10 mg/dL (ref 6–20)
CO2: 19 mmol/L — ABNORMAL LOW (ref 22–32)
Calcium: 9.4 mg/dL (ref 8.9–10.3)
Chloride: 102 mmol/L (ref 98–111)
Creatinine, Ser: 0.69 mg/dL (ref 0.44–1.00)
GFR, Estimated: >60 mL/min
Glucose, Bld: 75 mg/dL (ref 70–99)
Potassium: 4.5 mmol/L (ref 3.5–5.1)
Sodium: 136 mmol/L (ref 135–145)
Total Bilirubin: 0.4 mg/dL (ref 0.0–1.2)
Total Protein: 6.9 g/dL (ref 6.5–8.1)

## 2024-06-06 LAB — CBC
HCT: 36.8 % (ref 36.0–46.0)
HCT: 38.3 % (ref 36.0–46.0)
HCT: 31.1 % — ABNORMAL LOW (ref 36.0–46.0)
Hemoglobin: 12.2 g/dL (ref 12.0–15.0)
Hemoglobin: 12.8 g/dL (ref 12.0–15.0)
Hemoglobin: 10.4 g/dL — ABNORMAL LOW (ref 12.0–15.0)
MCH: 27.9 pg (ref 26.0–34.0)
MCH: 28 pg (ref 26.0–34.0)
MCH: 28.2 pg (ref 26.0–34.0)
MCHC: 33.2 g/dL (ref 30.0–36.0)
MCHC: 33.4 g/dL (ref 30.0–36.0)
MCHC: 33.4 g/dL (ref 30.0–36.0)
MCV: 84 fL (ref 80.0–100.0)
MCV: 83.8 fL (ref 80.0–100.0)
MCV: 84.3 fL (ref 80.0–100.0)
Platelets: 224 K/uL (ref 150–400)
Platelets: 217 K/uL (ref 150–400)
Platelets: 191 K/uL (ref 150–400)
RBC: 4.38 MIL/uL (ref 3.87–5.11)
RBC: 4.57 MIL/uL (ref 3.87–5.11)
RBC: 3.69 MIL/uL — ABNORMAL LOW (ref 3.87–5.11)
RDW: 13.4 % (ref 11.5–15.5)
RDW: 13.7 % (ref 11.5–15.5)
RDW: 13.5 % (ref 11.5–15.5)
WBC: 9.7 K/uL (ref 4.0–10.5)
WBC: 9.4 K/uL (ref 4.0–10.5)
WBC: 16.4 K/uL — ABNORMAL HIGH (ref 4.0–10.5)
nRBC: 0 % (ref 0.0–0.2)
nRBC: 0 % (ref 0.0–0.2)
nRBC: 0 % (ref 0.0–0.2)

## 2024-06-06 MED ORDER — IBUPROFEN 600 MG PO TABS
600.0000 mg | ORAL_TABLET | Freq: Four times a day (QID) | ORAL | Status: DC
  Administered 2024-06-06 – 2024-06-08 (×7): 600 mg via ORAL
  Filled 2024-06-06 (×7): qty 1

## 2024-06-06 MED ORDER — ONDANSETRON HCL 4 MG/2ML IJ SOLN: 4.0000 mg | INTRAMUSCULAR | Status: DC | PRN

## 2024-06-06 MED ORDER — FENTANYL CITRATE (PF) 100 MCG/2ML IJ SOLN
100.0000 ug | INTRAMUSCULAR | Status: DC | PRN
  Administered 2024-06-06: 100 ug via INTRAVENOUS
  Filled 2024-06-06: qty 2

## 2024-06-06 MED ORDER — OXYCODONE HCL 5 MG PO TABS: 5.0000 mg | ORAL_TABLET | ORAL | Status: DC | PRN

## 2024-06-06 MED ORDER — LACTATED RINGERS IV SOLN: 500.0000 mL | Freq: Once | INTRAVENOUS | Status: DC

## 2024-06-06 MED ORDER — NIFEDIPINE ER OSMOTIC RELEASE 30 MG PO TB24: 30.0000 mg | ORAL_TABLET | Freq: Every day | ORAL | Status: DC

## 2024-06-06 MED ORDER — LACTATED RINGERS IV SOLN: INTRAVENOUS | Status: DC

## 2024-06-06 MED ORDER — SENNOSIDES-DOCUSATE SODIUM 8.6-50 MG PO TABS
2.0000 | ORAL_TABLET | ORAL | Status: DC
  Administered 2024-06-07 – 2024-06-08 (×2): 2 via ORAL
  Filled 2024-06-06 (×2): qty 2

## 2024-06-06 MED ORDER — MISOPROSTOL 200 MCG PO TABS
ORAL_TABLET | ORAL | Status: AC
  Filled 2024-06-06: qty 4

## 2024-06-06 MED ORDER — LIDOCAINE HCL (PF) 1 % IJ SOLN
INTRAMUSCULAR | Status: DC | PRN
  Administered 2024-06-06: 8 mL via EPIDURAL

## 2024-06-06 MED ORDER — COCONUT OIL OIL: 1.0000 | TOPICAL_OIL | Status: DC | PRN

## 2024-06-06 MED ORDER — BENZOCAINE-MENTHOL 20-0.5 % EX AERO
1.0000 | INHALATION_SPRAY | CUTANEOUS | Status: DC | PRN
  Administered 2024-06-06: 1 via TOPICAL
  Filled 2024-06-06: qty 56

## 2024-06-06 MED ORDER — ZOLPIDEM TARTRATE 5 MG PO TABS: 5.0000 mg | ORAL_TABLET | Freq: Every evening | ORAL | Status: DC | PRN

## 2024-06-06 MED ORDER — FENTANYL-BUPIVACAINE-NACL 0.5-0.125-0.9 MG/250ML-% EP SOLN
12.0000 mL/h | EPIDURAL | Status: DC | PRN
  Administered 2024-06-06: 12 mL/h via EPIDURAL
  Filled 2024-06-06: qty 250

## 2024-06-06 MED ORDER — DIPHENHYDRAMINE HCL 25 MG PO CAPS: 25.0000 mg | ORAL_CAPSULE | Freq: Four times a day (QID) | ORAL | Status: DC | PRN

## 2024-06-06 MED ORDER — DIPHENHYDRAMINE HCL 50 MG/ML IJ SOLN: 12.5000 mg | INTRAMUSCULAR | Status: DC | PRN

## 2024-06-06 MED ORDER — PHENYLEPHRINE 80 MCG/ML (10ML) SYRINGE FOR IV PUSH (FOR BLOOD PRESSURE SUPPORT): 80.0000 ug | PREFILLED_SYRINGE | INTRAVENOUS | Status: DC | PRN

## 2024-06-06 MED ORDER — MISOPROSTOL 200 MCG PO TABS
800.0000 ug | ORAL_TABLET | Freq: Once | ORAL | Status: AC
  Administered 2024-06-06: 800 ug via BUCCAL

## 2024-06-06 MED ORDER — PRENATAL MULTIVITAMIN CH
1.0000 | ORAL_TABLET | Freq: Every day | ORAL | Status: DC
  Administered 2024-06-07 – 2024-06-08 (×2): 1 via ORAL
  Filled 2024-06-06 (×2): qty 1

## 2024-06-06 MED ORDER — OXYTOCIN-SODIUM CHLORIDE 30-0.9 UT/500ML-% IV SOLN: 2.5000 [IU]/h | INTRAVENOUS | Status: DC | PRN

## 2024-06-06 MED ORDER — EPHEDRINE 5 MG/ML INJ: 10.0000 mg | INTRAVENOUS | Status: DC | PRN

## 2024-06-06 MED ORDER — TETANUS-DIPHTH-ACELL PERTUSSIS 5-2-15.5 LF-MCG/0.5 IM SUSP: 0.5000 mL | Freq: Once | INTRAMUSCULAR | Status: DC

## 2024-06-06 MED ORDER — SIMETHICONE 80 MG PO CHEW: 80.0000 mg | CHEWABLE_TABLET | ORAL | Status: DC | PRN

## 2024-06-06 MED ORDER — ONDANSETRON HCL 4 MG PO TABS: 4.0000 mg | ORAL_TABLET | ORAL | Status: DC | PRN

## 2024-06-06 MED ORDER — ACETAMINOPHEN 325 MG PO TABS: 650.0000 mg | ORAL_TABLET | ORAL | Status: DC | PRN

## 2024-06-06 MED ORDER — MEASLES, MUMPS & RUBELLA VAC ~~LOC~~ SUSR: 0.5000 mL | Freq: Once | SUBCUTANEOUS | Status: DC

## 2024-06-06 MED ORDER — DIBUCAINE (PERIANAL) 1 % EX OINT: 1.0000 | TOPICAL_OINTMENT | CUTANEOUS | Status: DC | PRN

## 2024-06-06 MED ORDER — WITCH HAZEL-GLYCERIN EX PADS: 1.0000 | MEDICATED_PAD | CUTANEOUS | Status: DC | PRN

## 2024-06-06 NOTE — Lactation Note (Signed)
 This note was copied from a baby's chart. Lactation Consultation Note  Patient Name: Kimberly Cummings Date: 06/06/2024 Age:28 hours Reason for consult: Initial assessment;1st time breastfeeding;NICU baby;Term MOB finished using the DEBP when LC entered the room. MOB will continue to pump every 3 hours for 15 minutes on initial setting. MOB will follow NICU infant feeding guidelines. MOB knows that her EBM is safe for 4 hours at room temperature. Handout given, Storage and Preparation of Breast Milk. MOB was made aware of O/P services, breastfeeding support groups, community resources, and our phone # for post-discharge questions.    Maternal Data Has patient been taught Hand Expression?: Yes Does the patient have breastfeeding experience prior to this delivery?: No  Feeding Mother's Current Feeding Choice: Breast Milk and Donor Milk  LATCH Score                    Lactation Tools Discussed/Used Tools: Pump Breast pump type: Double-Electric Breast Pump Pump Education: Setup, frequency, and cleaning;Milk Storage Reason for Pumping: Infant in NICU Pumping frequency: MOB will continue to pump every 3 hours for 15 minutes on inital setting.  Interventions Interventions: Breast feeding basics reviewed;DEBP;Education;Guidelines for Milk Supply and Pumping Schedule Handout;CDC milk storage guidelines;CDC Guidelines for Breast Pump Cleaning;LC Services brochure  Discharge Pump: DEBP;Manual;Personal  Consult Status Consult Status: Follow-up Date: 06/07/24 Follow-up type: In-patient    Kimberly Cummings 06/06/2024, 11:22 PM

## 2024-06-06 NOTE — Progress Notes (Signed)
 Labor Progress Note Kendell Gammon is a 29 y.o. G2P0010 at [redacted]w[redacted]d presented for eIOL for LGA S: feeling pain relief from epidural  O:  BP 135/72   Pulse (!) 54   Temp 98 F (36.7 C) (Oral)   Resp 16   Ht 5' 2 (1.575 m)   Wt 121.9 kg   LMP 09/12/2023 (Approximate)   SpO2 99%   BMI 49.16 kg/m  EFM: 130bpm/moderate variability/+ accels  CVE: Dilation: 4 Effacement (%): 70 Cervical Position: Posterior Station: -1 Presentation: Vertex Exam by:: S Payne,CNM   A&P: 29 y.o. G2P0010 [redacted]w[redacted]d for eIOL for LGA  #Labor: Progressing well. S/p cytotec  - AROM performed - Continue pitocin  #Pain: epidural in place #FWB: Category I tracing #GBS negative   Garen SHAUNNA Puffer, MD 5:23 AM

## 2024-06-06 NOTE — Anesthesia Preprocedure Evaluation (Signed)
"                                    Anesthesia Evaluation  Patient identified by MRN, date of birth, ID band Patient awake    Reviewed: Allergy & Precautions, H&P , NPO status , Patient's Chart, lab work & pertinent test results  Airway Mallampati: II  TM Distance: >3 FB Neck ROM: Full    Dental no notable dental hx.    Pulmonary neg pulmonary ROS   Pulmonary exam normal breath sounds clear to auscultation       Cardiovascular hypertension (preE), Normal cardiovascular exam Rhythm:Regular Rate:Normal     Neuro/Psych  PSYCHIATRIC DISORDERS Anxiety     negative neurological ROS     GI/Hepatic negative GI ROS, Neg liver ROS,,,  Endo/Other    Class 3 obesity (BMI 49)  Renal/GU negative Renal ROS  negative genitourinary   Musculoskeletal negative musculoskeletal ROS (+)    Abdominal  (+) + obese  Peds negative pediatric ROS (+)  Hematology negative hematology ROS (+) Hb 12.2, plt 224   Anesthesia Other Findings   Reproductive/Obstetrics negative OB ROS                              Anesthesia Physical Anesthesia Plan  ASA: 3  Anesthesia Plan: Epidural   Post-op Pain Management:    Induction:   PONV Risk Score and Plan: 2  Airway Management Planned: Natural Airway  Additional Equipment: None  Intra-op Plan:   Post-operative Plan:   Informed Consent: I have reviewed the patients History and Physical, chart, labs and discussed the procedure including the risks, benefits and alternatives for the proposed anesthesia with the patient or authorized representative who has indicated his/her understanding and acceptance.       Plan Discussed with:   Anesthesia Plan Comments:         Anesthesia Quick Evaluation  "

## 2024-06-06 NOTE — Progress Notes (Signed)
 29 yo g2p0010 @ 39+1 here for IOL for macrosomia, also found to have mild preE which remains mild. S/p cytotec  and arom, is on pitocin , has had a normal labor course thus far. Comfortable with epidural. Cat 1 tracing. Extrapolated efw is 4600 g, no gdm. Patient has been counseled on risks/benefits of attempt at vaginal delivery vs cesarean and we reviewed those, patient is aware she is an appropriate candidate for attempt at vaginal delivery but does have a greater than average risk of birth trauma. Will continue pitocin , hopeful for vaginal delivery. preE is asymptomatic, BPs mild, will repeat labs today.

## 2024-06-06 NOTE — Anesthesia Procedure Notes (Signed)
 Epidural Patient location during procedure: OB Start time: 06/06/2024 3:55 AM End time: 06/06/2024 4:01 AM  Staffing Anesthesiologist: Merla Almarie HERO, DO Performed: anesthesiologist   Preanesthetic Checklist Completed: patient identified, IV checked, risks and benefits discussed, monitors and equipment checked, pre-op evaluation and timeout performed  Epidural Patient position: sitting Prep: DuraPrep and site prepped and draped Patient monitoring: continuous pulse ox, blood pressure, heart rate and cardiac monitor Approach: midline Location: L3-L4 Injection technique: LOR air  Needle:  Needle type: Tuohy  Needle gauge: 17 G Needle length: 9 cm Needle insertion depth: 9 cm Catheter type: closed end flexible Catheter size: 19 Gauge Catheter at skin depth: 14 cm Test dose: negative  Assessment Sensory level: T8 Events: blood not aspirated, no cerebrospinal fluid, injection not painful, no injection resistance, no paresthesia and negative IV test  Additional Notes Patient identified. Risks/Benefits/Options discussed with patient including but not limited to bleeding, infection, nerve damage, paralysis, failed block, incomplete pain control, headache, blood pressure changes, nausea, vomiting, reactions to medication both or allergic, itching and postpartum back pain. Confirmed with bedside nurse the patient's most recent platelet count. Confirmed with patient that they are not currently taking any anticoagulation, have any bleeding history or any family history of bleeding disorders. Patient expressed understanding and wished to proceed. All questions were answered. Sterile technique was used throughout the entire procedure. Please see nursing notes for vital signs. Test dose was given through epidural catheter and negative prior to continuing to dose epidural or start infusion. Warning signs of high block given to the patient including shortness of breath, tingling/numbness in  hands, complete motor block, or any concerning symptoms with instructions to call for help. Patient was given instructions on fall risk and not to get out of bed. All questions and concerns addressed with instructions to call with any issues or inadequate analgesia.    Somewhat difficult placement at one level, DPE with 22G Pencan through tuohy for confirmation of loss- clear CSF, no issues. Reason for block:procedure for pain

## 2024-06-06 NOTE — Discharge Summary (Cosign Needed)
 "    Postpartum Discharge Summary     Patient Name: Kimberly Cummings DOB: November 16, 1995 MRN: [id number]  Date of admission: 06/05/2024 Delivery date:06/06/2024 Delivering provider: KANDIS ASA BEDFORD Date of discharge: 06/08/2024  Admitting diagnosis: Large for gestational age fetus affecting management of mother [O36.60X0] Intrauterine pregnancy: [redacted]w[redacted]d     Secondary diagnosis:  Principal Problem:   Mild preeclampsia, third trimester Active Problems:   Obesity (BMI 30-39.9)   Large for gestational age fetus affecting management of mother  Additional problems:     Discharge diagnosis: Term Pregnancy Delivered                                              Post partum procedures:none Augmentation: AROM, Pitocin , and Cytotec  Complications: None  Hospital course: Induction of Labor With Vaginal Delivery   29 y.o. yo G2P1011 at [redacted]w[redacted]d was admitted to the hospital 06/05/2024 for induction of labor.  Indication for induction: suspected LGA. Mild preeclampsia diagnosed intrapartum.  Patient had an labor course complicated by nothing Membrane Rupture Time/Date: 4:43 AM,06/06/2024  Delivery Method:Vaginal, Vacuum (Extractor) Operative Delivery:Device used:Kiwi Indication: Fetal indications (bradycardia) Episiotomy: None Lacerations:  1st degree Details of delivery can be found in separate delivery note.  Patient had a postpartum course complicated by none. Patient is discharged home 06/08/24.  Newborn Data: Birth date:06/06/2024 Birth time:5:19 PM Gender:Female Living status:Living Apgars:6 ,7  Weight:3900 g  Magnesium Sulfate received: No BMZ received: No Rhophylac:N/A MMR:N/A T-DaP:Given prenatally Flu: Yes RSV Vaccine received: Yes Transfusion:No  Immunizations received: Immunization History  Administered Date(s) Administered    sv, Bivalent, Protein Subunit Rsvpref,pf Marlow) 05/20/2024   HPV 9-valent 07/16/2009, 10/03/2023   Influenza, Seasonal, Injecte, Preservative Fre  01/24/2024   Tdap 04/01/2024    Physical exam  Vitals:   06/07/24 1604 06/07/24 1810 06/07/24 2115 06/08/24 0533  BP: 128/64 127/71 122/85 107/73  Pulse: 84 96 100 78  Resp: 19 20 18 18   Temp: (!) 97.5 F (36.4 C) 98.2 F (36.8 C) 98.3 F (36.8 C) 98 F (36.7 C)  TempSrc: Oral Oral Oral Oral  SpO2: 98% 99% 99% 99%  Weight:      Height:       General: alert and cooperative Lochia: appropriate Uterine Fundus: firm Incision: N/A DVT Evaluation: 1+ pitting to the knees b/l Labs: Lab Results  Component Value Date   WBC 16.4 (H) 06/06/2024   HGB 10.4 (L) 06/06/2024   HCT 31.1 (L) 06/06/2024   MCV 84.3 06/06/2024   PLT 191 06/06/2024      Latest Ref Rng & Units 06/06/2024   10:41 AM  CMP  Glucose 70 - 99 mg/dL 75   BUN 6 - 20 mg/dL 10   Creatinine 9.55 - 1.00 mg/dL 9.30   Sodium 864 - 854 mmol/L 136   Potassium 3.5 - 5.1 mmol/L 4.5   Chloride 98 - 111 mmol/L 102   CO2 22 - 32 mmol/L 19   Calcium 8.9 - 10.3 mg/dL 9.4   Total Protein 6.5 - 8.1 g/dL 6.9   Total Bilirubin 0.0 - 1.2 mg/dL 0.4   Alkaline Phos 38 - 126 U/L 235   AST 15 - 41 U/L 36   ALT 0 - 44 U/L 22    Edinburgh Score:    06/07/2024    5:46 PM  Edinburgh Postnatal Depression Scale Screening Tool  I have been able  to laugh and see the funny side of things. 0  I have looked forward with enjoyment to things. 0  I have blamed myself unnecessarily when things went wrong. 1  I have been anxious or worried for no good reason. 0  I have felt scared or panicky for no good reason. 0  Things have been getting on top of me. 1  I have been so unhappy that I have had difficulty sleeping. 0  I have felt sad or miserable. 0  I have been so unhappy that I have been crying. 0  The thought of harming myself has occurred to me. 0  Edinburgh Postnatal Depression Scale Total 2   Edinburgh Postnatal Depression Scale Total: 2   After visit meds:  Allergies as of 06/08/2024       Reactions   Citrus Swelling    Tongue swelling with citrus fruits        Medication List     TAKE these medications    Acetaminophen  Extra Strength 500 MG Tabs Take 2 tablets (1,000 mg total) by mouth every 6 (six) hours as needed for moderate pain (pain score 4-6).   furosemide  20 MG tablet Commonly known as: Lasix  Take 1 tablet (20 mg total) by mouth daily.   ibuprofen  600 MG tablet Commonly known as: ADVIL  Take 1 tablet (600 mg total) by mouth every 6 (six) hours as needed for moderate pain (pain score 4-6).   potassium chloride  SA 20 MEQ tablet Commonly known as: KLOR-CON  M Take 1 tablet (20 mEq total) by mouth daily.   Prenatal Vitamins 28-0.8 MG Tabs 1 tablet Orally Once a day   Stool Softener/Laxative 50-8.6 MG tablet Generic drug: senna-docusate Take 2 tablets by mouth at bedtime as needed for mild constipation or moderate constipation.         Discharge home in stable condition Infant Feeding: Breast Infant Disposition:home with mother Discharge instruction: per After Visit Summary and Postpartum booklet. Activity: Advance as tolerated. Pelvic rest for 6 weeks.  Diet: routine diet Future Appointments: Future Appointments  Date Time Provider Department Center  06/13/2024 10:40 AM [facility] NURSE [facility]  07/08/2024  2:35 PM Danny Geralds, DO [facility]   Follow up Visit:  Follow-up Information     [facility] Follow [facility].   Specialty: Obstetrics and Gynecology Contact information: [address] [place]  [address] [phone]                Message sent to mcw on 1/22 by nbw  Please schedule this patient for a In person postpartum visit in 4 weeks with the following provider: Any provider. Additional Postpartum F/U:BP check 1 week  High risk pregnancy complicated by: preeclampsia Delivery mode:  Vaginal, Vacuum (Extractor) Anticipated Birth Control:  outpatient nexplanon   06/08/2024 Geralds Danny, DO    "

## 2024-06-07 ENCOUNTER — Encounter (HOSPITAL_COMMUNITY): Payer: Self-pay | Admitting: Obstetrics and Gynecology

## 2024-06-07 MED ORDER — FUROSEMIDE 20 MG PO TABS
20.0000 mg | ORAL_TABLET | Freq: Every day | ORAL | Status: DC
  Administered 2024-06-07 – 2024-06-08 (×2): 20 mg via ORAL
  Filled 2024-06-07 (×2): qty 1

## 2024-06-07 MED ORDER — POTASSIUM CHLORIDE CRYS ER 20 MEQ PO TBCR
20.0000 meq | EXTENDED_RELEASE_TABLET | Freq: Every day | ORAL | Status: DC
  Administered 2024-06-07 – 2024-06-08 (×2): 20 meq via ORAL
  Filled 2024-06-07 (×2): qty 1

## 2024-06-07 NOTE — Lactation Note (Signed)
 This note was copied from a baby's chart.  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 Noticed that pumping hasn't been consistent. Explained the importance of consistent pumping/nipple stimulation for the onset of secretory activation and to protect her supply.   Resized her flanges to # 18 today and discussed getting inserts for her home and her insurance pump. Her plan is to do both, direct breastfeeding along with pumping and bottle feeding with her own breastmilk, baby having a bottle at the time of Kimberly Cummings consult, showed parents how to pace feed and asked them to call for latch assistance when needed.   OBJECTIVE Infant data: Mother's Current Feeding Choice: Breast Milk and Donor Milk  O2 Device: Room Air FiO2 (%): 21 %  Infant feeding assessment IDFS - Readiness: 1 IDFS - Quality: 1   Maternal data: G2P1011 Vaginal, Vacuum (Extractor) Has patient been taught Hand Expression?: Yes Current breast feeding challenges:: NICU admission Does the patient have breastfeeding experience prior to this delivery?: No Pumping frequency: 2 times/24 hours Pumped volume: -- (drops) Flange Size: 18 Risk factor for low/delayed milk supply:: primipara, PCOS, PPH of 698 cc., infant separation  Pump: Referral sent for Stork Pump, Personal, Hands Free (Lansinoh wearable)  ASSESSMENT Infant: Feeding Status: Ad lib Feeding method: Bottle Nipple Type: Nfant Standard Flow (white)  Maternal: Milk volume: Normal  INTERVENTIONS/PLAN Interventions: Interventions: Breast feeding  basics reviewed; DEBP; Education; Coconut oil Tools: Pump; Flanges; Coconut oil Pump Education: Setup, frequency, and cleaning; Milk Storage  Plan: STS around care times Breast massage, hand expression and coconut oil prior to pumping Offer the breast +8 times/24 hour or sooner if feeding cues are present Parents will continue supplementing with donor milk per feeding choice on admission Pump whenever baby is getting a bottle  Kimberly Cummings present and supportive. All questions and concerns answered, family to contact [facility] services PRN.  Consult Status: Follow-up NICU Follow-up type: New admission follow up   Kimberly Cummings 06/07/2024, 1:33 PM

## 2024-06-07 NOTE — Lactation Note (Signed)
 This note was copied from a baby's chart. Lactation Consultation Note  Patient Name: Kimberly Cummings Unijb'd Date: 06/07/2024 Age:28 hours   P1. F/u on mom and feedings. Mom stated she is struggling w/latching and baby is very fussy. LC asked mom to call for [facility] for next feeding mom stated OK.  Maternal Data    Feeding    LATCH Score                    Lactation Tools Discussed/Used    Interventions    Discharge    Consult Status      Shamir Tuzzolino G 06/07/2024, 11:35 PM

## 2024-06-07 NOTE — Anesthesia Postprocedure Evaluation (Signed)
"   Anesthesia Post Note  Patient: Kimberly Cummings  Procedure(s) Performed: AN AD HOC LABOR EPIDURAL     Patient location during evaluation: OB High Risk Anesthesia Type: Epidural Level of consciousness: awake and alert Pain management: pain level controlled Vital Signs Assessment: post-procedure vital signs reviewed and stable Respiratory status: spontaneous breathing, nonlabored ventilation and respiratory function stable Cardiovascular status: stable Postop Assessment: no headache, no backache, epidural receding, no apparent nausea or vomiting, patient able to bend at knees, able to ambulate and adequate PO intake Anesthetic complications: no   No notable events documented.  Last Vitals:  Vitals:   06/07/24 0543 06/07/24 0753  BP: (!) 111/59 114/60  Pulse: 75 67  Resp: 18 18  Temp: 36.8 C 36.6 C  SpO2: 98% 98%    Last Pain:  Vitals:   06/07/24 0753  TempSrc: Oral  PainSc:    Pain Goal:                   Loye Reininger Hristova      "

## 2024-06-07 NOTE — Progress Notes (Signed)
 Post Partum Day 1 VAVD Subjective: no complaints, up ad lib, voiding, and tolerating PO. Reports bleeding is normal.   Objective: Blood pressure 114/60, pulse 67, temperature 97.8 F (36.6 C), temperature source Oral, resp. rate 18, height 5' 2 (1.575 m), weight 121.9 kg, last menstrual period 09/12/2023, SpO2 98%, unknown if currently breastfeeding.  Physical Exam:  General: alert, cooperative, and no distress Lochia: appropriate Uterine Fundus: firm Incision: NA DVT Evaluation: No evidence of DVT seen on physical exam.  Recent Labs    06/06/24 1041 06/06/24 2003  HGB 12.8 10.4*  HCT 38.3 31.1*    Assessment/Plan: Postpartum - Contraception: Nexplanon outpt - MOF: Breast - Rh status: Rh pos - Rubella status: Immune - Dispo: PPD2 - Consults: Lactation  PreE without SF - Continue Lasix /K - Bps normotensive.  Neonatal - In NICU   LOS: 2 days   Vina Solian 06/07/2024, 8:08 AM

## 2024-06-07 NOTE — Lactation Note (Signed)
 This note was copied from a baby's chart. Lactation Consultation Note  Patient Name: Kimberly Cummings Unijb'd Date: 06/07/2024 Age:27 hours Reason for consult: Follow-up assessment;Primapara;1st time breastfeeding;NICU baby;Term;Maternal endocrine disorder  Spectra  S2 Stork pump delivered to patient's room the second time this LC came back to assist family in [place] (first visit was in NICU).   Consult Status Consult Status: Follow-up Date: 06/08/24 Follow-up type: In-patient    Mahari Strahm S Tia Gelb 06/07/2024, 1:40 PM

## 2024-06-08 ENCOUNTER — Other Ambulatory Visit (HOSPITAL_COMMUNITY): Payer: Self-pay

## 2024-06-08 LAB — BIRTH TISSUE RECOVERY COLLECTION (PLACENTA DONATION)

## 2024-06-08 MED ORDER — FUROSEMIDE 20 MG PO TABS
20.0000 mg | ORAL_TABLET | Freq: Every day | ORAL | 0 refills | Status: AC
  Filled 2024-06-08: qty 3, 3d supply, fill #0

## 2024-06-08 MED ORDER — IBUPROFEN 600 MG PO TABS
600.0000 mg | ORAL_TABLET | Freq: Four times a day (QID) | ORAL | 0 refills | Status: AC | PRN
  Filled 2024-06-08: qty 30, 8d supply, fill #0

## 2024-06-08 MED ORDER — ACETAMINOPHEN 500 MG PO TABS
1000.0000 mg | ORAL_TABLET | Freq: Four times a day (QID) | ORAL | 0 refills | Status: AC | PRN
  Filled 2024-06-08: qty 30, 4d supply, fill #0

## 2024-06-08 MED ORDER — POTASSIUM CHLORIDE CRYS ER 20 MEQ PO TBCR
20.0000 meq | EXTENDED_RELEASE_TABLET | Freq: Every day | ORAL | 0 refills | Status: AC
  Filled 2024-06-08: qty 3, 3d supply, fill #0

## 2024-06-08 MED ORDER — SENNOSIDES-DOCUSATE SODIUM 8.6-50 MG PO TABS
2.0000 | ORAL_TABLET | Freq: Every evening | ORAL | 0 refills | Status: AC | PRN
  Filled 2024-06-08: qty 15, 7d supply, fill #0

## 2024-06-08 NOTE — Lactation Note (Signed)
 This note was copied from a baby's chart. Lactation Consultation Note  Patient Name: Kimberly Cummings Unijb'd Date: 06/08/2024 Age:28 hours Reason for consult: Follow-up assessment;1st time breastfeeding;Mother's request Per MOB, she has been making breastfeeding attempts, supplementing infant with donor breast milk and been using the DEBP. MOB latched infant on her left breast using the cross cradle hold with pillow support, infant was on and off the breast, not sustaining his latch, multiple attempts made. LC gave MOB 20 mm NS, that was pre-filled with 0.5 mls of donor milk. Infant started sustaining his latch and feeding at the breast, MOB plans to continue to supplement infant with any of her pumped EBM first and then donor milk. MOB on Day 2 will offer 12 mls or more if infant want it of EBM/donor milk per feeding. MOB will continue to breastfeed infant every 8+ times within 24 hours, skin to skin. MOB knows to call for further latch assistance if needed.   Maternal Data    Feeding Mother's Current Feeding Choice: Breast Milk and Donor Milk  LATCH Score Latch: Grasps breast easily, tongue down, lips flanged, rhythmical sucking.  Audible Swallowing: A few with stimulation  Type of Nipple: Everted at rest and after stimulation (short shafted)  Comfort (Breast/Nipple): Soft / non-tender  Hold (Positioning): Assistance needed to correctly position infant at breast and maintain latch.  LATCH Score: 8   Lactation Tools Discussed/Used Tools: Pump Breast pump type: Double-Electric Breast Pump Pump Education: Setup, frequency, and cleaning;Milk Storage Reason for Pumping: Infant not latching well, using donor milk and NS Pumping frequency: MOB will continue to use the DEBP every 3 hours for 15 minutes.  Interventions Interventions: Assisted with latch;Skin to skin;Reverse pressure;Breast compression;Adjust position;Support pillows;Position options  Discharge    Consult  Status Consult Status: Follow-up Date: 06/08/24 Follow-up type: In-patient    Grayce LULLA Batter 06/08/2024, 12:57 AM

## 2024-06-08 NOTE — Lactation Note (Signed)
 This note was copied from a baby's chart. Lactation Consultation Note  Patient Name: Kimberly Cummings Unijb'd Date: 06/08/2024 Age:28 hours Reason for consult: Follow-up assessment;Infant weight loss;Primapara;1st time breastfeeding;Term (1 % weight loss,) Per mom the baby recently fed the baby at the breast 3 mins and supplemented with 15 ml of donor milk.  LC recommended since grandmother brought  her breakfast and the baby has eaten to call for [facility] for the next feeding so the NS can be resized  that was given to mom by the night LC .  And D/C teaching for breast feeding.    Maternal Data Does the patient have breastfeeding experience prior to this delivery?: No  Feeding Mother's Current Feeding Choice: Breast Milk and Donor Milk  LATCH Score - 8 with LC     Lactation Tools Discussed/Used Tools: Pump;Nipple Shields Nipple shield size: 20;Other (comment) (per mom was given a NS #20 from the night LC and baby was able to sustain a latch longer than the baby hed been) Flange Size: 18 Breast pump type: Double-Electric Breast Pump;Manual Pump Education: Setup, frequency, and cleaning;Milk Storage Pumped volume:  (per mom hasn't been consistently been pumping)  Interventions Interventions: Breast feeding basics reviewed;Education;DEBP;Hand pump;LC Services brochure;CDC milk storage guidelines;CDC Guidelines for Breast Pump Cleaning  Discharge Pump: Received Stork Pump;Personal;Hands Free  Consult Status Consult Status: Follow-up Date: 06/08/24 Follow-up type: In-patient    [name] 06/08/2024, 9:10 AM

## 2024-06-10 ENCOUNTER — Encounter: Admitting: Physician Assistant

## 2024-06-11 ENCOUNTER — Encounter: Payer: Self-pay | Admitting: Obstetrics and Gynecology

## 2024-06-13 ENCOUNTER — Ambulatory Visit: Payer: Self-pay | Admitting: Obstetrics and Gynecology

## 2024-06-13 ENCOUNTER — Other Ambulatory Visit: Payer: Self-pay

## 2024-06-13 ENCOUNTER — Encounter: Payer: Self-pay | Admitting: Obstetrics and Gynecology

## 2024-06-13 ENCOUNTER — Ambulatory Visit: Payer: Self-pay

## 2024-06-13 VITALS — BP 136/88 | HR 99 | Wt 245.0 lb

## 2024-06-13 DIAGNOSIS — N949 Unspecified condition associated with female genital organs and menstrual cycle: Secondary | ICD-10-CM

## 2024-06-13 NOTE — Progress Notes (Signed)
" ° °  GYNECOLOGY OFFICE VISIT NOTE  History:  Kimberly Cummings is a 29 y.o. G2P1011 here today for check of her perineum.   She notes increasing discomfort since her last appointment. She delivered on 1/22 and was VAVD with 1st degree and hymenal lac. She is doing tylenol  and ibuprofen  and it helps. She wants to be sure it is not infected.   She is breastfeeding.   The following portions of the patient's history were reviewed and updated as appropriate: allergies, current medications, past family history, past medical history, past social history, past surgical history and problem list.   Review of Systems:  Pertinent items noted in HPI and remainder of comprehensive ROS otherwise negative.  Physical Exam:  BP 136/88   Pulse 99   Wt 245 lb (111.1 kg)   LMP 09/12/2023 (Approximate)   Breastfeeding Yes   BMI 44.81 kg/m  CONSTITUTIONAL: Well-developed, well-nourished female in no acute distress.  HEENT:  Normocephalic, atraumatic. External right and left ear normal. No scleral icterus.  NECK: Normal range of motion, supple, no masses noted on observation SKIN: No rash noted. Not diaphoretic. No erythema. No pallor. MUSCULOSKELETAL: Normal range of motion. No edema noted. NEUROLOGIC: Alert and oriented to person, place, and time. Normal muscle tone coordination. No cranial nerve deficit noted. PSYCHIATRIC: Normal mood and affect. Normal behavior. Normal judgment and thought content.  PELVIC: First degree perineal lac still approximated by suture. Healing well. No evidence of infection.    Assessment and Plan:  1. Perineal discomfort in female (Primary) Reviewed perineal care options Continue ibuprofen  and tylenol  Reassurance given    Routine preventative health maintenance measures emphasized. Please refer to After Visit Summary for other counseling recommendations.   No follow-ups on file.  Vina Solian, MD, FACOG Obstetrician & Gynecologist, [facility] for  [facility], [facility]      "

## 2024-07-08 ENCOUNTER — Ambulatory Visit: Payer: Self-pay
